# Patient Record
Sex: Female | Born: 1995 | Race: Black or African American | Hispanic: No | Marital: Single | Smoking: Never smoker
Health system: Southern US, Community
[De-identification: ages and names within clinical notes are randomized; demographics above are authoritative.]

## PROBLEM LIST (undated history)

## (undated) DIAGNOSIS — R4589 Other symptoms and signs involving emotional state: Secondary | ICD-10-CM

## (undated) DIAGNOSIS — Z8659 Personal history of other mental and behavioral disorders: Secondary | ICD-10-CM

## (undated) DIAGNOSIS — Z975 Presence of (intrauterine) contraceptive device: Secondary | ICD-10-CM

## (undated) DIAGNOSIS — R51 Headache: Secondary | ICD-10-CM

## (undated) DIAGNOSIS — R519 Headache, unspecified: Secondary | ICD-10-CM

## (undated) DIAGNOSIS — N926 Irregular menstruation, unspecified: Principal | ICD-10-CM

## (undated) HISTORY — DX: Irregular menstruation, unspecified: N92.6

## (undated) HISTORY — DX: Presence of (intrauterine) contraceptive device: Z97.5

## (undated) HISTORY — DX: Other symptoms and signs involving emotional state: R45.89

## (undated) HISTORY — PX: TONSILLECTOMY: SUR1361

---
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
## 2015-01-11 NOTE — L&D Delivery Note (Signed)
Delivery Note  Pt admitted for FHR variables and possible latent phase labor. Ctx began to increase spontaneously during the night, and then as pt was up in the bathroom she began having an urge to push. She got back to bed and pushed briefly and at 2:35 AM a viable female was delivered via Vaginal, Spontaneous Delivery (Presentation: ; Occiput Anterior). Pt in hands and knees position.  APGAR: 8, 9; weight: pending .  Cord clamped and cut by mother of pt while pt still in H&K due to short cord. Hospital cord blood sample collected.  Placenta status: Intact, Spontaneous.  Cord: 3 vessels with the following complications: Short.   Anesthesia: None  Episiotomy: None Lacerations:  none Est. Blood Loss (mL):    Mom to postpartum.  Baby to Couplet care / Skin to Skin.  Cam Hai CNM 03/12/2015, 2:50 AM

## 2015-02-23 ENCOUNTER — Other Ambulatory Visit: Payer: Self-pay | Admitting: Obstetrics & Gynecology

## 2015-02-23 DIAGNOSIS — Z1389 Encounter for screening for other disorder: Secondary | ICD-10-CM

## 2015-02-24 ENCOUNTER — Ambulatory Visit (INDEPENDENT_AMBULATORY_CARE_PROVIDER_SITE_OTHER): Payer: Medicaid Other

## 2015-02-24 DIAGNOSIS — Z3A38 38 weeks gestation of pregnancy: Secondary | ICD-10-CM | POA: Diagnosis not present

## 2015-02-24 DIAGNOSIS — Z1389 Encounter for screening for other disorder: Secondary | ICD-10-CM

## 2015-02-24 DIAGNOSIS — Z36 Encounter for antenatal screening of mother: Secondary | ICD-10-CM

## 2015-02-24 NOTE — Progress Notes (Signed)
[place] 37+4 wks by today's ultrasound,post pl gr 3,cephalic,efw 3421 g,normal ov's bilat, afi 12 cm,fhr 141 bpm,rt pyelectasis 9.71mm,fhr 141 bpm,anatomy complete but limited because of fetal age.

## 2015-02-25 ENCOUNTER — Ambulatory Visit (INDEPENDENT_AMBULATORY_CARE_PROVIDER_SITE_OTHER): Payer: Medicaid Other | Admitting: Advanced Practice Midwife

## 2015-02-25 ENCOUNTER — Encounter: Payer: Self-pay | Admitting: Advanced Practice Midwife

## 2015-02-25 VITALS — BP 104/68 | HR 100 | Wt 202.0 lb

## 2015-02-25 DIAGNOSIS — O0930 Supervision of pregnancy with insufficient antenatal care, unspecified trimester: Secondary | ICD-10-CM

## 2015-02-25 DIAGNOSIS — Z1389 Encounter for screening for other disorder: Secondary | ICD-10-CM | POA: Diagnosis not present

## 2015-02-25 DIAGNOSIS — Z3493 Encounter for supervision of normal pregnancy, unspecified, third trimester: Secondary | ICD-10-CM | POA: Diagnosis not present

## 2015-02-25 DIAGNOSIS — Z0283 Encounter for blood-alcohol and blood-drug test: Secondary | ICD-10-CM

## 2015-02-25 DIAGNOSIS — Z369 Encounter for antenatal screening, unspecified: Secondary | ICD-10-CM

## 2015-02-25 DIAGNOSIS — Z3A38 38 weeks gestation of pregnancy: Secondary | ICD-10-CM

## 2015-02-25 DIAGNOSIS — Z3490 Encounter for supervision of normal pregnancy, unspecified, unspecified trimester: Secondary | ICD-10-CM | POA: Insufficient documentation

## 2015-02-25 DIAGNOSIS — Z331 Pregnant state, incidental: Secondary | ICD-10-CM | POA: Diagnosis not present

## 2015-02-25 DIAGNOSIS — O09893 Supervision of other high risk pregnancies, third trimester: Secondary | ICD-10-CM | POA: Insufficient documentation

## 2015-02-25 DIAGNOSIS — Z3685 Encounter for antenatal screening for Streptococcus B: Secondary | ICD-10-CM

## 2015-02-25 LAB — POCT URINALYSIS DIPSTICK
Glucose, UA: NEGATIVE
Ketones, UA: NEGATIVE
Leukocytes, UA: NEGATIVE
Nitrite, UA: NEGATIVE
PROTEIN UA: NEGATIVE
RBC UA: NEGATIVE

## 2015-02-25 NOTE — Progress Notes (Signed)
  Subjective:    Catherine Le is a G2P1001 [redacted]w[redacted]d being seen today for her first obstetrical visit.  Her obstetrical history is significant for late to care at 37 weeks.,  Has a 45 month old .  Pregnancy history fully reviewed.  Patient reports no complaints.  Filed Vitals:   02/25/15 1410  BP: 104/68  Pulse: 100  Weight: 202 lb (91.627 kg)    HISTORY: OB History  Gravida Para Term Preterm AB SAB TAB Ectopic Multiple Living  0 1    # Outcome Date GA Lbr Len/2nd Weight Sex Delivery Anes PTL Lv  2 Current           1 Term 01/03/14 110w2d / 02:48 7 lb 1 oz (3.204 kg) F Vag-Spont None N Y     Past Medical History  Diagnosis Date  . Headache    Past Surgical History  Procedure Laterality Date  . Tonsillectomy     Family History  Problem Relation Age of Onset  . Hypertension Father   . Asthma Father   . Hypertension Maternal Grandmother   . Cancer Maternal Grandmother     lung  . Hypertension Maternal Grandfather   . Cancer Maternal Aunt     breast   US 37+4 wks by today's ultrasound,post pl gr 3,cephalic,efw 3421 g,normal ov's bilat, afi 12 cm,fhr 141 bpm,rt pyelectasis 9.69mm,fhr 141 bpm,anatomy complete but limited because of fetal age.  Exam       Pelvic Exam:    Perineum: Normal Perineum   Vulva: normal   Vagina:  normal mucosa, normal discharge, no palpable nodules   Uterus Normal, Gravid, FH: 38     Cervix: LTC   Adnexa: Not palpable   Urinary:  urethral meatus normal    System:     Skin: normal coloration and turgor, no rashes    Neurologic: oriented, normal, normal mood   Extremities: normal strength, tone, and muscle mass   HEENT PERRLA   Mouth/Teeth mucous membranes moist, normal dentition   Neck supple and no masses   Cardiovascular: regular rate and rhythm   Respiratory:  appears well, vitals normal, no respiratory distress, acyanotic   Abdomen: soft, non-tender;  FHR: 150          Assessment:    Pregnancy:  G2P1001 Patient Active Problem List   Diagnosis Date Noted  . Supervision of normal pregnancy 02/25/2015  . Short interval between pregnancies affecting pregnancy in third trimester, antepartum 02/25/2015  . Late prenatal care affecting pregnancy 11/18/2013        Plan:     Initial labs drawn, going to do a one hour gtt today Continue prenatal vitamins  Problem list reviewed and updated  Encouraged well-balanced diet Genetic Screening:  Too late.  Ultrasound discussed; fetal survey: results reviewed.  Return in about 1 week (aroDANNIE WOOLEN/22/2017) for LROB.  CRESENZO-DISHMAN,[name] 02/25/2015

## 2015-02-25 NOTE — Patient Instructions (Signed)
Tdap Vaccine  It is recommended that you get the Tdap vaccine during the third trimester of EACH pregnancy to help protect your baby from getting pertussis (whooping cough)  27-36 weeks is the BEST time to do this so that you can pass the protection on to your baby. During pregnancy is better than after pregnancy, but if you are unable to get it during pregnancy it will be offered at the hospital.   You can get this vaccine at the health department or your family doctor  Everyone who will be around your baby should also be up-to-date on their vaccines. Adults (who are not pregnant) only need 1 dose of Tdap during adulthood.    AM I IN LABOR? What is labor? Labor is the work that your body does to birth your baby. Your uterus (the womb) contracts. Your cervix (the mouth of the uterus) opens. You will push your baby out into the world.  What do contractions (labor pains) feel like? When they first start, contractions usually feel like cramps during your period. Sometimes you feel pain in your back. Most often, contractions feel like muscles pulling painfully in your lower belly. At first, the contractions will probably be 15 to 20 minutes apart. They will not feel too painful. As labor goes on, the contractions get stronger, closer together, and more painful.  How do I time the contractions? Time your contractions by counting the number of minutes from the start of one contraction to the start of the next contraction.  What should I do when the contractions start? If it is night and you can sleep, sleep. If it happens during the day, here are some things you can do to take care of yourself at home: ? Walk. If the pains you are having are real labor, walking will make the contractions come faster and harder. If the contractions are not going to continue and be real labor, walking will make the contractions slow down. ? Take a shower or bath. This will help you relax. ? Eat. Labor is a big  event. It takes a lot of energy. ? Drink water. Not drinking enough water can cause false labor (contractions that hurt but do not open your cervix). If this is true labor, drinking water will help you have strength to get through your labor. ? Take a nap. Get all the rest you can. ? Get a massage. If your labor is in your back, a strong massage on your lower back may feel very good. Getting a foot massage is always good. ? Don't panic. You can do this. Your body was made for this. You are strong!  When should I go to the hospital or call my health care provider? ? Your contractions have been 5 minutes apart or less for at least 1 hour. ? If several contractions are so painful you cannot walk or talk during one. ? Your bag of waters breaks. (You may have a big gush of water or just water that runs down your legs when you walk.)  Are there other reasons to call my health care provider? Yes, you should call your health care provider or go to the hospital if you start to bleed like you are having a period- blood that soaks your underwear or runs down your legs, if you have sudden severe pain, if your baby has not moved for several hours, or if you are leaking green fluid. The rule is as follows: If you are very concerned about  something, call.

## 2015-02-26 LAB — AB SCR+ANTIBODY ID: ANTIBODY SCREEN: POSITIVE — AB

## 2015-02-26 LAB — MICROSCOPIC EXAMINATION: Casts: NONE SEEN /lpf

## 2015-02-26 LAB — URINALYSIS, ROUTINE W REFLEX MICROSCOPIC
BILIRUBIN UA: NEGATIVE
Glucose, UA: NEGATIVE
Ketones, UA: NEGATIVE
NITRITE UA: NEGATIVE
PH UA: 7 (ref 5.0–7.5)
Protein, UA: NEGATIVE
RBC UA: NEGATIVE
Specific Gravity, UA: 1.019 (ref 1.005–1.030)
UUROB: 1 mg/dL (ref 0.2–1.0)

## 2015-02-26 LAB — URINE CULTURE

## 2015-02-26 LAB — ANTIBODY SCREEN

## 2015-02-26 LAB — PMP SCREEN PROFILE (10S), URINE
Amphetamine Screen, Ur: NEGATIVE ng/mL
BENZODIAZEPINE SCREEN, URINE: NEGATIVE ng/mL
Barbiturate Screen, Ur: NEGATIVE ng/mL
CANNABINOIDS UR QL SCN: NEGATIVE ng/mL
Cocaine(Metab.)Screen, Urine: NEGATIVE ng/mL
Creatinine(Crt), U: 118.5 mg/dL (ref 20.0–300.0)
Methadone Scn, Ur: NEGATIVE ng/mL
OXYCODONE+OXYMORPHONE UR QL SCN: NEGATIVE ng/mL
Opiate Scrn, Ur: NEGATIVE ng/mL
PCP Scrn, Ur: NEGATIVE ng/mL
Ph of Urine: 6.9 (ref 4.5–8.9)
Propoxyphene, Screen: NEGATIVE ng/mL

## 2015-02-26 LAB — CBC
HEMATOCRIT: 32.2 % — AB (ref 34.0–46.6)
Hemoglobin: 10.1 g/dL — ABNORMAL LOW (ref 11.1–15.9)
MCH: 25.6 pg — ABNORMAL LOW (ref 26.6–33.0)
MCHC: 31.4 g/dL — AB (ref 31.5–35.7)
MCV: 82 fL (ref 79–97)
Platelets: 335 10*3/uL (ref 150–379)
RBC: 3.94 x10E6/uL (ref 3.77–5.28)
RDW: 15.8 % — ABNORMAL HIGH (ref 12.3–15.4)
WBC: 10.2 10*3/uL (ref 3.4–10.8)

## 2015-02-26 LAB — HIV ANTIBODY (ROUTINE TESTING W REFLEX): HIV SCREEN 4TH GENERATION: NONREACTIVE

## 2015-02-26 LAB — HEPATITIS B SURFACE ANTIGEN: HEP B S AG: NEGATIVE

## 2015-02-26 LAB — RPR: RPR: NONREACTIVE

## 2015-02-26 LAB — GLUCOSE TOLERANCE, 1 HOUR: Glucose, 1Hr PP: 63 mg/dL — ABNORMAL LOW (ref 65–199)

## 2015-02-27 LAB — GC/CHLAMYDIA PROBE AMP
CHLAMYDIA, DNA PROBE: NEGATIVE
NEISSERIA GONORRHOEAE BY PCR: NEGATIVE

## 2015-02-27 LAB — STREP GP B NAA: Strep Gp B NAA: POSITIVE — AB

## 2015-03-02 ENCOUNTER — Encounter: Payer: Self-pay | Admitting: Obstetrics & Gynecology

## 2015-03-06 ENCOUNTER — Ambulatory Visit (INDEPENDENT_AMBULATORY_CARE_PROVIDER_SITE_OTHER): Payer: Medicaid Other | Admitting: Obstetrics and Gynecology

## 2015-03-06 ENCOUNTER — Encounter: Payer: Self-pay | Admitting: Obstetrics and Gynecology

## 2015-03-06 VITALS — BP 118/76 | HR 96 | Wt 202.0 lb

## 2015-03-06 DIAGNOSIS — Z3A39 39 weeks gestation of pregnancy: Secondary | ICD-10-CM

## 2015-03-06 DIAGNOSIS — Z331 Pregnant state, incidental: Secondary | ICD-10-CM

## 2015-03-06 DIAGNOSIS — Z3493 Encounter for supervision of normal pregnancy, unspecified, third trimester: Secondary | ICD-10-CM

## 2015-03-06 DIAGNOSIS — O0933 Supervision of pregnancy with insufficient antenatal care, third trimester: Secondary | ICD-10-CM | POA: Diagnosis not present

## 2015-03-06 DIAGNOSIS — Z1389 Encounter for screening for other disorder: Secondary | ICD-10-CM

## 2015-03-06 DIAGNOSIS — Z3483 Encounter for supervision of other normal pregnancy, third trimester: Secondary | ICD-10-CM | POA: Diagnosis not present

## 2015-03-06 DIAGNOSIS — O0930 Supervision of pregnancy with insufficient antenatal care, unspecified trimester: Secondary | ICD-10-CM

## 2015-03-06 LAB — POCT URINALYSIS DIPSTICK
Blood, UA: NEGATIVE
GLUCOSE UA: NEGATIVE
Ketones, UA: NEGATIVE
LEUKOCYTES UA: NEGATIVE
NITRITE UA: NEGATIVE

## 2015-03-06 NOTE — Progress Notes (Signed)
Pt denies any problems or concerns at this time.  

## 2015-03-06 NOTE — Progress Notes (Signed)
Patient ID: Catherine Le, female   DOB: 12/17/1995, 20 y.o.   MRN: [id number]  G2P1001 [redacted]w[redacted]d Estimated Date of Delivery: 03/13/15  Blood pressure 118/76, pulse 96, weight 202 lb (91.627 kg), unknown if currently breastfeeding.    Catherine Le is a 20 y.o. female who presents today for a routine prenatal visit. Pt notes that this is her second pregnancy and that her first pregnancy was a vaginal delivery. Pt denies any issues or complaints at this time. She states that her contraceptive measures following this pregnancy will be either the nuvaring or nexplanon. Pt selecting Nexplanon, as she'll breast feed.  FHR: 143 FH: 36 cm  refer to the ob flow sheet for FH and FHR, also BP, Wt, Urine results: negative  Patient reports good fetal movement, denies any bleeding and no rupture of membranes symptoms or regular contractions. Patient complaints: N/A.  Questions were answered. Assessment: LROB G2P1001 @ [redacted]w[redacted]d  Plan:  Continued routine obstetrical care.  F/u in 1 week for routine prenatal visit br fdg BC= nexplanon  By signing my name below, I, Soijett Blue, attest that this documentation has been prepared under the direction and in the presence of Tilda Burrow, MD. Electronically Signed: Soijett Blue, ED Scribe. 03/06/2015. 11:15 AM.  I personally performed the services described in this documentation, which was SCRIBED in my presence. The recorded information has been reviewed and considered accurate. It has been edited as necessary during review. Tilda Burrow, MD

## 2015-03-10 ENCOUNTER — Telehealth: Payer: Self-pay | Admitting: *Deleted

## 2015-03-10 NOTE — Telephone Encounter (Signed)
She says she can't hold her pee, but I think her water may have broke, put on clean pad and go to MAU now.

## 2015-03-11 ENCOUNTER — Encounter (HOSPITAL_COMMUNITY): Payer: Self-pay | Admitting: *Deleted

## 2015-03-11 ENCOUNTER — Inpatient Hospital Stay (HOSPITAL_COMMUNITY)
Admission: AD | Admit: 2015-03-11 | Discharge: 2015-03-14 | DRG: 775 | Disposition: A | Discharge status: Home / Self Care | Payer: Medicaid Other | Source: Ambulatory Visit | Attending: Obstetrics & Gynecology | Admitting: Obstetrics & Gynecology

## 2015-03-11 DIAGNOSIS — IMO0002 Reserved for concepts with insufficient information to code with codable children: Secondary | ICD-10-CM | POA: Diagnosis present

## 2015-03-11 DIAGNOSIS — O09893 Supervision of other high risk pregnancies, third trimester: Secondary | ICD-10-CM

## 2015-03-11 DIAGNOSIS — O99824 Streptococcus B carrier state complicating childbirth: Secondary | ICD-10-CM | POA: Diagnosis present

## 2015-03-11 DIAGNOSIS — Z3A39 39 weeks gestation of pregnancy: Secondary | ICD-10-CM

## 2015-03-11 DIAGNOSIS — O4292 Full-term premature rupture of membranes, unspecified as to length of time between rupture and onset of labor: Secondary | ICD-10-CM | POA: Diagnosis present

## 2015-03-11 DIAGNOSIS — O358XX0 Maternal care for other (suspected) fetal abnormality and damage, not applicable or unspecified: Secondary | ICD-10-CM | POA: Diagnosis present

## 2015-03-11 DIAGNOSIS — Z8249 Family history of ischemic heart disease and other diseases of the circulatory system: Secondary | ICD-10-CM | POA: Diagnosis not present

## 2015-03-11 DIAGNOSIS — Z3493 Encounter for supervision of normal pregnancy, unspecified, third trimester: Secondary | ICD-10-CM

## 2015-03-11 LAB — CBC
HCT: 32 % — ABNORMAL LOW (ref 36.0–46.0)
HEMOGLOBIN: 10.5 g/dL — AB (ref 12.0–15.0)
MCH: 26.2 pg (ref 26.0–34.0)
MCHC: 32.8 g/dL (ref 30.0–36.0)
MCV: 79.8 fL (ref 78.0–100.0)
PLATELETS: 315 10*3/uL (ref 150–400)
RBC: 4.01 MIL/uL (ref 3.87–5.11)
RDW: 16.2 % — ABNORMAL HIGH (ref 11.5–15.5)
WBC: 13.7 10*3/uL — ABNORMAL HIGH (ref 4.0–10.5)

## 2015-03-11 LAB — AMNISURE RUPTURE OF MEMBRANE (ROM) NOT AT ARMC: AMNISURE: NEGATIVE

## 2015-03-11 MED ORDER — OXYTOCIN BOLUS FROM INFUSION
500.0000 mL | INTRAVENOUS | Status: DC
Start: 2015-03-11 — End: 2015-03-12
  Administered 2015-03-12: 500 mL via INTRAVENOUS

## 2015-03-11 MED ORDER — OXYCODONE-ACETAMINOPHEN 5-325 MG PO TABS: 1.0000 | ORAL_TABLET | ORAL | Status: DC | PRN

## 2015-03-11 MED ORDER — ONDANSETRON HCL 4 MG/2ML IJ SOLN
4.0000 mg | Freq: Four times a day (QID) | INTRAMUSCULAR | Status: DC | PRN
  Administered 2015-03-12: 4 mg via INTRAVENOUS
  Filled 2015-03-11: qty 2

## 2015-03-11 MED ORDER — LIDOCAINE HCL (PF) 1 % IJ SOLN
30.0000 mL | INTRAMUSCULAR | Status: DC | PRN
  Filled 2015-03-11: qty 30

## 2015-03-11 MED ORDER — OXYTOCIN 10 UNIT/ML IJ SOLN
2.5000 [IU]/h | INTRAVENOUS | Status: DC
  Administered 2015-03-12: 2.5 [IU]/h via INTRAVENOUS
  Filled 2015-03-11: qty 10

## 2015-03-11 MED ORDER — FENTANYL CITRATE (PF) 100 MCG/2ML IJ SOLN
100.0000 ug | INTRAMUSCULAR | Status: DC | PRN
  Administered 2015-03-12: 100 ug via INTRAVENOUS
  Filled 2015-03-11: qty 2

## 2015-03-11 MED ORDER — PENICILLIN G POTASSIUM 5000000 UNITS IJ SOLR
5.0000 10*6.[IU] | Freq: Once | INTRAVENOUS | Status: AC
  Administered 2015-03-12: 5 10*6.[IU] via INTRAVENOUS
  Filled 2015-03-11: qty 5

## 2015-03-11 MED ORDER — CITRIC ACID-SODIUM CITRATE 334-500 MG/5ML PO SOLN: 30.0000 mL | ORAL | Status: DC | PRN

## 2015-03-11 MED ORDER — OXYCODONE-ACETAMINOPHEN 5-325 MG PO TABS: 2.0000 | ORAL_TABLET | ORAL | Status: DC | PRN

## 2015-03-11 MED ORDER — LACTATED RINGERS IV SOLN
INTRAVENOUS | Status: DC
Start: 2015-03-11 — End: 2015-03-12
  Administered 2015-03-12: 01:00:00 via INTRAVENOUS

## 2015-03-11 MED ORDER — ACETAMINOPHEN 325 MG PO TABS: 650.0000 mg | ORAL_TABLET | ORAL | Status: DC | PRN

## 2015-03-11 MED ORDER — LACTATED RINGERS IV SOLN
500.0000 mL | INTRAVENOUS | Status: DC | PRN
  Administered 2015-03-11: 500 mL via INTRAVENOUS

## 2015-03-11 MED ORDER — DEXTROSE 5 % IV SOLN
2.5000 10*6.[IU] | INTRAVENOUS | Status: DC
  Filled 2015-03-11 (×2): qty 2.5

## 2015-03-11 NOTE — MAU Note (Signed)
Pt presents complaining of possible SROM yesterday at 0600. Also having lower abdominal pain and pressure. Reports good fetal movement. Denies bleeding

## 2015-03-11 NOTE — H&P (Signed)
Catherine Le is a 20 y.o. female G2P1001 @ 39.5wks by 37wk scan (unk LMP) presenting for eval for SROM @ 0600 2/28. Denies reg ctx but having vag pressure. Her preg has been followed by [facility] since 2/14 and has been remarkable for 1) late to care 2) GBS pos 3) R fetal pyelectasis 9mm 4) anti-Lewis antibody + 5) social issues (FOB not involved; pt denial of preg)  History OB History    Gravida Para Term Preterm AB TAB SAB Ectopic Multiple Living   0 1     Past Medical History  Diagnosis Date  . Headache    Past Surgical History  Procedure Laterality Date  . Tonsillectomy     Family History: family history includes Asthma in her father; Cancer in her maternal aunt and maternal grandmother; Hypertension in her father, maternal grandfather, and maternal grandmother. Social History:  reports that she has never smoked. She has never used smokeless tobacco. She reports that she does not drink alcohol or use illicit drugs.   Prenatal Transfer Tool  Maternal Diabetes: No Genetic Screening: Declined- too late Maternal Ultrasounds/Referrals: Abnormal:  Findings:   Fetal renal pyelectasis- right, 9.66mm Fetal Ultrasounds or other Referrals:  None Maternal Substance Abuse:  No Significant Maternal Medications:  None Significant Maternal Lab Results:  Lab values include: Group B Strep positive Other Comments:  PNC since 37wks w/ [place] dating at that time  ROS  Dilation: 3 Effacement (%): 60 Station: -3 Exam by:: E. Siska, RN Blood pressure 121/80, pulse 96, temperature 98.2 F (36.8 C), temperature source Oral, resp. rate 18, unknown if currently breastfeeding. Exam Physical Exam  Constitutional: She is oriented to person, place, and time. She appears well-developed.  HENT:  Head: Normocephalic.  Neck: Normal range of motion.  Cardiovascular: Normal rate.   Respiratory: Effort normal.  GI:  EFM 130s, +accels, occ early variables w/ some ctx to 90-100s w/ spont  return; avg LTV Ctx irreg 3-6 mins  Musculoskeletal: Normal range of motion.  Neurological: She is alert and oriented to person, place, and time.  Skin: Skin is warm and dry.  Psychiatric: She has a normal mood and affect. Her behavior is normal. Thought content normal.    Amnisure: neg  Prenatal labs: ABO, Rh:  A pos (11/15) Antibody: Positive, See Final Results (02/15 1604) Rubella:  immune (11/15) RPR: Non Reactive (02/15 1604)  HBsAg: Negative (02/15 1604)  HIV: Non Reactive (02/15 1604)  GBS: Positive (02/15 1600)   Assessment/Plan: IUP@term  FHR variables Possible early labor GBS pos  Admit to [facility] for GBS ppx Expectant management Watch FHR If no cx change, augment prn   Charbel Los CNM 03/11/2015, 8:29 PM

## 2015-03-12 ENCOUNTER — Encounter (HOSPITAL_COMMUNITY): Payer: Self-pay | Admitting: *Deleted

## 2015-03-12 DIAGNOSIS — O99824 Streptococcus B carrier state complicating childbirth: Secondary | ICD-10-CM

## 2015-03-12 DIAGNOSIS — Z3A39 39 weeks gestation of pregnancy: Secondary | ICD-10-CM

## 2015-03-12 DIAGNOSIS — O4292 Full-term premature rupture of membranes, unspecified as to length of time between rupture and onset of labor: Secondary | ICD-10-CM

## 2015-03-12 DIAGNOSIS — O358XX0 Maternal care for other (suspected) fetal abnormality and damage, not applicable or unspecified: Secondary | ICD-10-CM

## 2015-03-12 LAB — RPR: RPR: NONREACTIVE

## 2015-03-12 MED ORDER — DIBUCAINE 1 % RE OINT: 1.0000 "application " | TOPICAL_OINTMENT | RECTAL | Status: DC | PRN

## 2015-03-12 MED ORDER — PRENATAL MULTIVITAMIN CH
1.0000 | ORAL_TABLET | Freq: Every day | ORAL | Status: DC
  Administered 2015-03-12 – 2015-03-14 (×3): 1 via ORAL
  Filled 2015-03-12 (×3): qty 1

## 2015-03-12 MED ORDER — ACETAMINOPHEN 325 MG PO TABS: 650.0000 mg | ORAL_TABLET | ORAL | Status: DC | PRN

## 2015-03-12 MED ORDER — BENZOCAINE-MENTHOL 20-0.5 % EX AERO
1.0000 "application " | INHALATION_SPRAY | CUTANEOUS | Status: DC | PRN
  Administered 2015-03-12: 1 via TOPICAL
  Filled 2015-03-12: qty 56

## 2015-03-12 MED ORDER — ONDANSETRON HCL 4 MG PO TABS: 4.0000 mg | ORAL_TABLET | ORAL | Status: DC | PRN

## 2015-03-12 MED ORDER — SENNOSIDES-DOCUSATE SODIUM 8.6-50 MG PO TABS
2.0000 | ORAL_TABLET | ORAL | Status: DC
  Administered 2015-03-13 (×2): 2 via ORAL
  Filled 2015-03-12 (×2): qty 2

## 2015-03-12 MED ORDER — WITCH HAZEL-GLYCERIN EX PADS: 1.0000 "application " | MEDICATED_PAD | CUTANEOUS | Status: DC | PRN

## 2015-03-12 MED ORDER — IBUPROFEN 600 MG PO TABS
600.0000 mg | ORAL_TABLET | Freq: Four times a day (QID) | ORAL | Status: DC
  Administered 2015-03-12 – 2015-03-14 (×10): 600 mg via ORAL
  Filled 2015-03-12 (×10): qty 1

## 2015-03-12 MED ORDER — DIPHENHYDRAMINE HCL 25 MG PO CAPS
25.0000 mg | ORAL_CAPSULE | Freq: Four times a day (QID) | ORAL | Status: DC | PRN
Start: 2015-03-12 — End: 2015-03-14

## 2015-03-12 MED ORDER — NALBUPHINE HCL 10 MG/ML IJ SOLN: 10.0000 mg | INTRAMUSCULAR | Status: DC | PRN

## 2015-03-12 MED ORDER — ZOLPIDEM TARTRATE 5 MG PO TABS: 5.0000 mg | ORAL_TABLET | Freq: Every evening | ORAL | Status: DC | PRN

## 2015-03-12 MED ORDER — ONDANSETRON HCL 4 MG/2ML IJ SOLN: 4.0000 mg | INTRAMUSCULAR | Status: DC | PRN

## 2015-03-12 MED ORDER — SIMETHICONE 80 MG PO CHEW: 80.0000 mg | CHEWABLE_TABLET | ORAL | Status: DC | PRN

## 2015-03-12 MED ORDER — TETANUS-DIPHTH-ACELL PERTUSSIS 5-2.5-18.5 LF-MCG/0.5 IM SUSP: 0.5000 mL | Freq: Once | INTRAMUSCULAR | Status: DC

## 2015-03-12 MED ORDER — LANOLIN HYDROUS EX OINT: TOPICAL_OINTMENT | CUTANEOUS | Status: DC | PRN

## 2015-03-12 NOTE — Lactation Note (Signed)
This note was copied from a baby's chart. Lactation Consultation Note  Baby 12 hours old.  Mother states she had trouble latching her first child and pumped instead of breastfeeding. Oral assessment indicated Baby tongue thrusting.   Mother leaking colostrum during consult on R side. RN requested possibly trying a NS and patient asked about NS when entering room. Attempted latching in football hold on R side.  Baby recently breastfed for 5-10 min on L side. Baby latched briefly but did not sustain suck. Applied #24NS and baby would not open to latch.  Mouthed nipple briefly and became sleepy. Suggest mother call for assistance w/ latching for next feeding.  Patient Name: Catherine Le Today's Date: 03/12/2015     Maternal Data    Feeding Feeding Type: Breast Fed Length of feed: 15 min  LATCH Score/Interventions Latch: Grasps breast easily, tongue down, lips flanged, rhythmical sucking.  Audible Swallowing: A few with stimulation Intervention(s): Skin to skin  Type of Nipple: Everted at rest and after stimulation (wide)  Comfort (Breast/Nipple): Soft / non-tender     Hold (Positioning): Full assist, staff holds infant at breast Intervention(s): Breastfeeding basics reviewed;Support Pillows;Position options;Skin to skin  LATCH Score: 7  Lactation Tools Discussed/Used     Consult Status      Dahlia Byes [facility] 03/12/2015, 2:42 PM

## 2015-03-12 NOTE — Lactation Note (Signed)
This note was copied from a baby's chart. Lactation Consultation Note  Mom pumped 7 mls of colostrum and disappointed she didn't obtain more volume.  Reassured that this is a good amount and sufficient for a feeding.  Reminded of baby's small stomach size.  Reviewed pros and cons of syringe/fingerfeeding vs bottle.  Mom chooses to give milk with a bottle nipple.  Report given to [facility] RN.  Patient Name: Catherine Le [id number] Date: 03/12/2015 Reason for consult: Follow-up assessment;Difficult latch   Maternal Data    Feeding Feeding Type: Breast Fed  LATCH Score/Interventions Latch: Repeated attempts needed to sustain latch, nipple held in mouth throughout feeding, stimulation needed to elicit sucking reflex. Intervention(s): Adjust position;Assist with latch;Breast massage;Breast compression  Audible Swallowing: None Intervention(s): Hand expression Intervention(s): Hand expression;Alternate breast massage  Type of Nipple: Everted at rest and after stimulation  Comfort (Breast/Nipple): Soft / non-tender     Hold (Positioning): Assistance needed to correctly position infant at breast and maintain latch. Intervention(s): Breastfeeding basics reviewed;Support Pillows;Position options  LATCH Score: 6  Lactation Tools Discussed/Used Tools: Nipple Shields Nipple shield size: 24 Pump Review: Setup, frequency, and cleaning;Milk Storage Initiated by:: LC Date initiated:: 03/12/15   Consult Status Consult Status: Follow-up Date: 03/13/15 Follow-up type: In-patient    Huston Foley 03/12/2015, 6:45 PM

## 2015-03-12 NOTE — Lactation Note (Signed)
This note was copied from a baby's chart. Lactation Consultation Note  Patient Name: Catherine Le Today's Date: 03/12/2015 Reason for consult: Initial assessment Visited with Mom, baby 8 hrs old.  This is Mom's 2nd baby, first baby she exclusively pumped and bottle fed as her choice.  So far baby has latched 3 times for 15 and 5 minutes, latch score of 9 given.   Mom falling asleep in bed as we were talking, and GMOB holding baby.  Encouraged Mom to call for assistance as needed with latches.  Mom aware of importance of manual breast expression to initiate and stimulate flow.  Encouraged skin to skin and feeding often on cue.   Brochure left with Mom, and told about our IP lactation services available to her.  To follow up in am.  Consult Status Consult Status: Follow-up Date: 03/13/15 Follow-up type: In-patient    Judee Clara 03/12/2015, 10:45 AM

## 2015-03-12 NOTE — Lactation Note (Signed)
This note was copied from a baby's chart. Lactation Consultation Note  Mom called out for latch assist.  Baby is 15 hours old and in a quiet alert state showing early feeding cues.  Assisted with positioning baby in cross cradle hold.  Mom can easily hand express colostrum.  Baby does not easily open wide.  She did latch on once for 30 seconds but mom took her off because latch wasn't comfortable.  Nipple shield applied but baby would not open her mouth.  Mom does not want to try anymore at this point and asking to be set up with a pump so she can pump and bottle feed as she did with her first baby.  I explained that baby is only 15 hours old and still learning.  Encouraged mom to continue to attempt putting the baby to breast and call for assist prn.  DEBP set up and initiated with instructions.  Mom will call out for assist with syringe feeding.  Patient Name: Catherine Le [id number] Date: 03/12/2015 Reason for consult: Follow-up assessment;Difficult latch   Maternal Data    Feeding Feeding Type: Breast Fed  LATCH Score/Interventions Latch: Repeated attempts needed to sustain latch, nipple held in mouth throughout feeding, stimulation needed to elicit sucking reflex. Intervention(s): Adjust position;Assist with latch;Breast massage;Breast compression  Audible Swallowing: None Intervention(s): Hand expression Intervention(s): Hand expression;Alternate breast massage  Type of Nipple: Everted at rest and after stimulation  Comfort (Breast/Nipple): Soft / non-tender     Hold (Positioning): Assistance needed to correctly position infant at breast and maintain latch. Intervention(s): Breastfeeding basics reviewed;Support Pillows;Position options  LATCH Score: 6  Lactation Tools Discussed/Used Tools: Nipple Shields Nipple shield size: 24 Pump Review: Setup, frequency, and cleaning;Milk Storage Initiated by:: LC Date initiated:: 03/12/15   Consult Status Consult Status:  Follow-up Date: 03/13/15 Follow-up type: In-patient    Huston Foley 03/12/2015, 6:25 PM

## 2015-03-13 ENCOUNTER — Encounter: Payer: Medicaid Other | Admitting: Obstetrics and Gynecology

## 2015-03-13 NOTE — Clinical Social Work Maternal (Signed)
CLINICAL SOCIAL WORK MATERNAL/CHILD NOTE  Patient Details  Name: Catherine Le MRN: [id number] Date of Birth: 01-10-1996  Date:  03/13/2015  Clinical Social Worker Initiating Note:  Loleta Books MSW, LCSW Date/ Time Initiated:  03/13/15/1130     Child's Name:  Unnamed at time of assessment   Legal Guardian:  Catherine Le FOB not involved   Need for Interpreter:  None   Date of Referral:  03/12/15     Reason for Referral:  Late or No Prenatal Care    Referral Source:  [facility]   Address:  [address] [place], [place] [address]  Phone number:  [phone]   Household Members:  Minor Children, Parents, Siblings   Natural Supports (not living in the home):  Immediate Family   Professional Supports: None   Employment: Consulting civil engineer   Type of Work:     Education:  Attending college (Nursing school)   Financial Resources:  Medicaid   Other Resources:  [facility]   Cultural/Religious Considerations Which May Impact Care:  None reported  Strengths:  Ability to meet basic needs , Pediatrician chosen , Home prepared for child    Risk Factors/Current Problems:   1. Late prenatal care at 38 weeks.   Cognitive State:  Able to Concentrate , Alert , Linear Thinking , Goal Oriented    Mood/Affect:  Bright , Comfortable , Happy    CSW Assessment:  CSW received request for consult due to MOB arriving late to prenatal care at [redacted]w[redacted]d.  MOB presented in a pleasant mood, displayed a full range in affect, and was receptive to visit.  MOB was observed to be holding and caring for the infant.    MOB stated that the infant had only recently started phototherapy. She stated that it is difficult to watch the infant when she begins to cry since she knows that it must be difficult for the infant and she has limitations on how she can best soothe her due to her needing to remain under the lights.  MOB expressed understanding for the need for phototherapy, and shared hopes that it  will only be short and brief.    MOB expressed eagerness and readiness for discharge. She stated that she has a one year old daughter at home.  MOB denied concerns about transitioning to caring for two, and stated that she is happy and excited. MOB shared that it was a shock and surprise at first, but has since become excited.  MOB stated that during her first pregnancy, she learned that she was pregnant when she was 8 months pregnant, and discussed how it was a fast transition for her to prepare for. MOB shared that with this pregnancy, she has had 2 months to prepare.  MOB discussed impressions how there was limited time to prepare, but stated that it was better than before. MOB shared that she lives with her mother and older sister, and all have assisted to prepare for the infant. MOB stated that they did not have baby items from her first child, so they had to start over.  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 MOB denied questions or concerns about balancing school work with parenting, and discussed looking forward to continuing school once she recovers from childbirth.  MOB denied history of perinatal mood disorders, and denied mental health complications during this pregnancy.  MOB presented as attentive and engaged as CSW provided education on perinatal mood disorders, and agreed to  follow up with her medical providers if needs arise.   CSW inquired about the events that led to prenatal care. MOB stated that she did not know that she was pregnant until 2 months ago, and then had a difficult time attending an appointment since [place] in [place]. MOB denied any additional barriers to accessing care, and denied any barriers to accessing care postpartum. MOB verbalized understanding of the hospital drug screen policy, and denied any substance use during the pregnancy.   MOB denied questions, concerns, or needs at  this time. She acknowledged ongoing availability of CSW, and agreed to contact if needs arise.   CSW Plan/Description:   1. Patient/Family Education-- Perinatal mood and anxiety disorder, hospital drug screen policy 2. CSW to monitor toxicology screen, and will refer to CPS if positive 3. No Further Intervention Required/No Barriers to Discharge    Kelby FamVenning, Johanthan Kneeland N, LCSW 03/13/2015, 12:14 PM

## 2015-03-13 NOTE — Progress Notes (Signed)
Post Partum Day 1 Subjective: no complaints, up ad lib, voiding and tolerating PO, small lochia, plans to breastfeed, [facility]  Objective: Blood pressure 119/71, pulse 80, temperature 98.4 F (36.9 C), temperature source Oral, resp. rate 16, height 5\' 1"  (1.549 m), weight 91.627 kg (202 lb), unknown if currently breastfeeding.  Physical Exam:  General: alert, cooperative and no distress Lochia:normal flow Chest: CTAB Heart: RRR no m/r/g Abdomen: +BS, soft, nontender,  Uterine Fundus: firm DVT Evaluation: No evidence of DVT seen on physical exam. Extremities: trace edema   Recent Labs  03/11/15 2100  HGB 10.5*  HCT 32.0*    Assessment/Plan: Plan for discharge tomorrow   LOS: 2 days   Catherine Le,Catherine Le Point 03/13/2015, 7:52 AM

## 2015-03-13 NOTE — Lactation Note (Signed)
This note was copied from a baby's chart. Lactation Consultation Note  Mother recently pumped approx 15 ml and has also been giving formula. Occasionally is latching but mainly wants to pump and bottle feed. Baby is on phototherapy. [facility] Encouraged mother to call if she would like assistance w/ latching or further questions.  Patient Name: Catherine Le HYQMV'HToday's Date: 03/13/2015 Reason for consult: Follow-up assessment   Maternal Data    Feeding Feeding Type: Bottle Fed - Formula Nipple Type: Slow - flow  LATCH Score/Interventions                      Lactation Tools Discussed/Used     Consult Status Consult Status: PRN    Hardie PulleyBerkelhammer, Ruth Boschen 03/13/2015, 1:54 PM

## 2015-03-14 ENCOUNTER — Ambulatory Visit: Payer: Self-pay

## 2015-03-14 MED ORDER — IBUPROFEN 600 MG PO TABS: 600.0000 mg | ORAL_TABLET | Freq: Four times a day (QID) | ORAL | Status: DC | PRN

## 2015-03-14 NOTE — Discharge Summary (Signed)
OB Discharge Summary     Patient Name: Catherine Le DOB: 1995/05/16 MRN: [id number]  Date of admission: 03/11/2015 Delivering MD: Cam Hai D   Date of discharge: 03/14/2015  Admitting diagnosis: 38WKS,CTX Intrauterine pregnancy: [redacted]w[redacted]d     Secondary diagnosis:  Active Problems:   FHR (fetal heart rate) nonreactive  Additional problems: fetal pyelectasis; anti-Lewis atb     Discharge diagnosis: Term Pregnancy Delivered                                                                                                Post partum procedures:none  Augmentation: none  Complications: None  Hospital course:  Onset of Labor With Vaginal Delivery     20 y.o. yo [id number] at [redacted]w[redacted]d was admitted in Latent Labor on 03/11/2015. Patient had an uncomplicated labor course as follows:  Membrane Rupture Time/Date: 2:25 AM ,03/10/2015   Intrapartum Procedures: Episiotomy: None [1]                                         Lacerations:  None [1]  Patient had a delivery of a Viable infant. 03/12/2015  Information for the patient's newborn:  Eraina, Winnie [[id number]]  Delivery Method: Vaginal, Spontaneous Delivery (Filed from Delivery Summary)    Pateint had an uncomplicated postpartum course.  She is ambulating, tolerating a regular diet, passing flatus, and urinating well. Patient is discharged home in stable condition on 03/14/2015.    Physical exam  Filed Vitals:   03/12/15 1700 03/13/15 0459 03/13/15 1826 03/14/15 0608  BP: 106/60 119/71 126/73 148/82  Pulse: 63 80 81 75  Temp: 98.4 F (36.9 C) 98.4 F (36.9 C) 98.5 F (36.9 C) 98.5 F (36.9 C)  TempSrc: Oral Oral Oral Oral  Resp: Height:      Weight:       General: alert and cooperative Lochia: appropriate Uterine Fundus: firm Incision: N/A DVT Evaluation: No evidence of DVT seen on physical exam. Labs: Lab Results  Component Value Date   WBC 13.7* 03/11/2015   HGB 10.5* 03/11/2015   HCT 32.0*  03/11/2015   MCV 79.8 03/11/2015   PLT 315 03/11/2015   No flowsheet data found.  Discharge instruction: per After Visit Summary and "Baby and Me Booklet".  After visit meds:    Medication List    TAKE these medications        ibuprofen 600 MG tablet  Commonly known as:  ADVIL,MOTRIN  Take 1 tablet (600 mg total) by mouth every 6 (six) hours as needed.     multivitamin-prenatal 27-0.8 MG Tabs tablet  Take 1 tablet by mouth daily at 12 noon. Reported on 03/06/2015        Diet: routine diet  Activity: Advance as tolerated. Pelvic rest for 6 weeks.   Outpatient follow up:6 weeks Follow up Appt:No future appointments. Follow up Visit:No Follow-up on file.  Postpartum contraception: Nexplanon  Newborn Data: Live born female  Birth Weight: 7 lb 0.5  oz (3189 g) APGAR: 8, 9  Baby Feeding: Breast Disposition:home with mother   03/14/2015 Cam HaiSHAW, KIMBERLY, CNM  7:47 AM

## 2015-03-14 NOTE — Discharge Instructions (Signed)

## 2015-03-14 NOTE — Lactation Note (Signed)
This note was copied from a baby's chart. Lactation Consultation Note  Patient Name: Catherine Le: 03/14/2015 Reason for consult: Follow-up assessment;Other (Comment) (3% weight loss , off photo tx , repeat Bili decreased )  Baby is being D/C today with mom and family. Per mom the baby has been getting bottles during the day when the  Baby isn't in to eating and I have put the baby to the breast at night and the baby has done well.  LC mentioned to mom if the baby is receiving a feeding from a bottle to work on increasing the amounts gradually, wake the baby  Up well , changing diaper, burping the baby ,feed skin to skin until the baby can stay awake for a feeding.  If breast feeding and latching , soften the 1st breast well before offering the 2nd breast and if the baby only feeds 1st breast, release  The 2nd breast to comfort. Sore nipple and engorgement prevention and tx reviewed.  LC discussed supply and demand and the importance of stimulation to both breast every 2 1/2 -3 hours with latching or pumping To establish and protect milk supply. [place] [facility] and the  Form didn't fax after 2 tries. LC will try again Sunday to fax form. Mom aware. [facility] And $30.oo cash received. LC instructed mom on the use and set up of the [facility] and mom had her pump pieces to pack.  Mother informed of post-discharge support and given phone number to the lactation department, including services for phone call assistance;  out-patient appointments; and breastfeeding support group. List of other breastfeeding resources in the community given in the handout. Encouraged  mother to call for problems or concerns related to breastfeeding.    Maternal Data    Feeding Feeding Type: Bottle Fed - Breast Milk  LATCH Score/Interventions                Intervention(s):  Breastfeeding basics reviewed     Lactation Tools Discussed/Used WIC Program: Yes ([place] [facility] )   Consult Status Consult Status: Follow-up    [name], [name] 03/14/2015, 4:53 PM

## 2015-03-15 LAB — TYPE AND SCREEN
ABO/RH(D): A POS
Antibody Screen: POSITIVE
DAT, IgG: NEGATIVE
UNIT DIVISION: 0
Unit division: 0

## 2015-04-09 ENCOUNTER — Ambulatory Visit (INDEPENDENT_AMBULATORY_CARE_PROVIDER_SITE_OTHER): Payer: Medicaid Other | Admitting: Advanced Practice Midwife

## 2015-04-09 ENCOUNTER — Encounter: Payer: Self-pay | Admitting: Advanced Practice Midwife

## 2015-04-09 NOTE — Progress Notes (Signed)
  Catherine Le is a 20 y.o. who presents for a postpartum visit. She is 4 weeks postpartum following a spontaneous vaginal delivery. I have fully reviewed the prenatal and intrapartum course. The delivery was at 39.2 gestational weeks.  Anesthesia: epidural. Postpartum course has been uneventful. Baby's course has been uneventful. Baby is feeding by breast (at night and morning only) and bottle. Wants to increase milk. . Bleeding: no bleeding. Bowel function is normal. Bladder function is normal. Patient is not sexually active. Contraception method is none. Postpartum depression screening: positive.(16).  C/O feeling overwhelmed.  Doesn't get along w/ mom very well.  Did not want to talk in front of mother--typed note on cell phone for me to read..  Denies SI/HI.  Counseling referral offered and accepted.    Current outpatient prescriptions:  .  ibuprofen (ADVIL,MOTRIN) 600 MG tablet, Take 1 tablet (600 mg total) by mouth every 6 (six) hours as needed., Disp: 30 tablet, Rfl: 0 .  Prenatal Vit-Fe Fumarate-FA (MULTIVITAMIN-PRENATAL) 27-0.8 MG TABS tablet, Take 1 tablet by mouth daily at 12 noon. Reported on 03/06/2015, Disp: , Rfl:   Review of Systems   Constitutional: Negative for fever and chills Eyes: Negative for visual disturbances Respiratory: Negative for shortness of breath, dyspnea Cardiovascular: Negative for chest pain or palpitations  Gastrointestinal: Negative for vomiting, diarrhea and constipation Genitourinary: Negative for dysuria and urgency Musculoskeletal: Negative for back pain, joint pain, myalgias  Neurological: Negative for dizziness and headaches   Objective:     Filed Vitals:   04/09/15 0842  BP: 120/80  Pulse: 84   General:  alert, cooperative and no distress   Breasts:  negative  Lungs: clear to auscultation bilaterally  Heart:  regular rate and rhythm  Abdomen: Soft, nontender   Vulva:  normal  Vagina: normal vagina  Cervix:  closed  Corpus: Well  involuted     Rectal Exam: no hemorrhoids        Assessment:    normal postpartum exam.  Plan:    1. Contraception: Nexplanon 2. Follow up in:  3 weeks for Nexplanon--no sex 3. Referral to [facility] made 4.  Feed more often and pump after feedings.  Get supplement that has fenugreek in it

## 2015-04-09 NOTE — Patient Instructions (Signed)
Mother's Tea or similar--active ingredient Fenugreek

## 2015-04-30 ENCOUNTER — Ambulatory Visit (INDEPENDENT_AMBULATORY_CARE_PROVIDER_SITE_OTHER): Payer: Medicaid Other | Admitting: Advanced Practice Midwife

## 2015-04-30 ENCOUNTER — Telehealth: Payer: Self-pay | Admitting: Advanced Practice Midwife

## 2015-04-30 ENCOUNTER — Encounter: Payer: Self-pay | Admitting: Advanced Practice Midwife

## 2015-04-30 VITALS — BP 124/80 | HR 66 | Ht 61.0 in | Wt 183.5 lb

## 2015-04-30 DIAGNOSIS — Z3202 Encounter for pregnancy test, result negative: Secondary | ICD-10-CM

## 2015-04-30 DIAGNOSIS — Z30017 Encounter for initial prescription of implantable subdermal contraceptive: Secondary | ICD-10-CM

## 2015-04-30 DIAGNOSIS — Z3049 Encounter for surveillance of other contraceptives: Secondary | ICD-10-CM | POA: Diagnosis not present

## 2015-04-30 LAB — POCT URINE PREGNANCY: Preg Test, Ur: NEGATIVE

## 2015-04-30 NOTE — Telephone Encounter (Signed)
I called pt because she has missed her appointment this morning and pt got upset when I told her she had to reschedule. I spoke with fran and she said it was okay. Pt now would like a call back From Drenda FreezeFran as soon as possible because she is upset.

## 2015-04-30 NOTE — Progress Notes (Signed)
  HPI:  Catherine Le is a 20 y.o. year old African American female here for Nexplanon insertion. She has not had sex since delivery, and her pregnancy test today was negative.  Risks/benefits/side effects of Nexplanon have been discussed and her questions have been answered.  Specifically, a failure rate of 01/998 has been reported, with an increased failure rate if pt takes St. John's Wort and/or antiseizure medicaitons.  Catherine Le is aware of the common side effect of irregular bleeding, which the incidence of decreases over time.   Past Medical History: Past Medical History  Diagnosis Date  . Headache     Past Surgical History: Past Surgical History  Procedure Laterality Date  . Tonsillectomy      Family History: Family History  Problem Relation Age of Onset  . Hypertension Father   . Asthma Father   . Hypertension Maternal Grandmother   . Cancer Maternal Grandmother     lung  . Hypertension Maternal Grandfather   . Cancer Maternal Aunt     breast    Social History: Social History  Substance Use Topics  . Smoking status: Never Smoker   . Smokeless tobacco: Never Used  . Alcohol Use: No    Allergies:  Allergies  Allergen Reactions  . Shrimp [Shellfish Allergy] Shortness Of Breath      Her left arm, approximatly 4 inches proximal from the elbow, was cleansed with alcohol and anesthetized with 2cc of 2% Lidocaine.  The area was cleansed again and the Nexplanon was inserted without difficulty.  A pressure bandage was applied.  Pt was instructed to remove pressure bandage in a few hours, and keep insertion site covered with a bandaid for 3 days.  Back up contraception was recommended for 2 weeks.  Follow-up scheduled PRN problems  CRESENZO-DISHMAN,Jniyah Dantuono 04/30/2015 9:50 AM

## 2015-05-25 ENCOUNTER — Telehealth: Payer: Self-pay | Admitting: Advanced Practice Midwife

## 2015-05-25 NOTE — Telephone Encounter (Signed)
Pt c/o vaginal bleeding with Nexplanon, requesting RX for Megace.

## 2015-05-26 ENCOUNTER — Other Ambulatory Visit: Payer: Self-pay | Admitting: Advanced Practice Midwife

## 2015-05-26 MED ORDER — MEGESTROL ACETATE 40 MG PO TABS: ORAL_TABLET | ORAL | Status: DC

## 2015-05-26 NOTE — Telephone Encounter (Signed)
rx sent

## 2015-06-16 ENCOUNTER — Ambulatory Visit (INDEPENDENT_AMBULATORY_CARE_PROVIDER_SITE_OTHER): Payer: Medicaid Other | Admitting: Adult Health

## 2015-06-16 ENCOUNTER — Encounter: Payer: Self-pay | Admitting: Adult Health

## 2015-06-16 VITALS — BP 110/76 | HR 88 | Ht 62.0 in | Wt 179.0 lb

## 2015-06-16 DIAGNOSIS — Z3202 Encounter for pregnancy test, result negative: Secondary | ICD-10-CM

## 2015-06-16 DIAGNOSIS — N926 Irregular menstruation, unspecified: Secondary | ICD-10-CM | POA: Diagnosis not present

## 2015-06-16 DIAGNOSIS — Z975 Presence of (intrauterine) contraceptive device: Secondary | ICD-10-CM

## 2015-06-16 DIAGNOSIS — R4589 Other symptoms and signs involving emotional state: Secondary | ICD-10-CM

## 2015-06-16 HISTORY — DX: Irregular menstruation, unspecified: N92.6

## 2015-06-16 HISTORY — DX: Other symptoms and signs involving emotional state: R45.89

## 2015-06-16 HISTORY — DX: Presence of (intrauterine) contraceptive device: Z97.5

## 2015-06-16 LAB — POCT URINE PREGNANCY: PREG TEST UR: NEGATIVE

## 2015-06-16 NOTE — Progress Notes (Signed)
Subjective:     Patient ID: Catherine Le, female   DOB: 1995/10/16, 20 y.o.   MRN: [id number]  HPI Catherine Le is a 20 year old black female in for a UPT, she had 3 +HPT and has been moody and easily teary.She had a vaginal delivery 04/07/15 and had nexplanon inserted 04/30/15.  Review of Systems +irregular bleeding  +moody and easily teary Reviewed past medical,surgical, social and family history. Reviewed medications and allergies.     Objective:   Physical Exam BP 110/76 mmHg  Pulse 88  Ht 5\' 2"  (1.575 m)  Wt 179 lb (81.194 kg)  BMI 32.73 kg/m2  LMP 05/19/2015  Breastfeeding? No UPT negative, nexplanon easily palpated left arm. [facility]    Assessment:     Irregular bleeding nexplanon in place [place] UPT negative     Plan:     Check QHCG, will talk in am Follow up prn

## 2015-06-16 NOTE — Patient Instructions (Signed)
Will talk in tomorrow

## 2015-06-17 ENCOUNTER — Telehealth: Payer: Self-pay | Admitting: Adult Health

## 2015-06-17 LAB — BETA HCG QUANT (REF LAB)

## 2015-06-17 NOTE — Telephone Encounter (Signed)
Left message that [facility] it is negative

## 2015-08-02 ENCOUNTER — Emergency Department (HOSPITAL_COMMUNITY)
Admission: EM | Admit: 2015-08-02 | Discharge: 2015-08-02 | Disposition: A | Discharge status: Home / Self Care | Payer: 59 | Attending: Emergency Medicine | Admitting: Emergency Medicine

## 2015-08-02 DIAGNOSIS — R197 Diarrhea, unspecified: Secondary | ICD-10-CM | POA: Insufficient documentation

## 2015-08-02 DIAGNOSIS — R112 Nausea with vomiting, unspecified: Secondary | ICD-10-CM

## 2015-08-02 DIAGNOSIS — N390 Urinary tract infection, site not specified: Secondary | ICD-10-CM | POA: Diagnosis not present

## 2015-08-02 LAB — CBC WITH DIFFERENTIAL/PLATELET
BASOS PCT: 0 %
Basophils Absolute: 0 10*3/uL (ref 0.0–0.1)
EOS PCT: 1 %
Eosinophils Absolute: 0.1 10*3/uL (ref 0.0–0.7)
HEMATOCRIT: 34.3 % — AB (ref 36.0–46.0)
Hemoglobin: 11.4 g/dL — ABNORMAL LOW (ref 12.0–15.0)
LYMPHS ABS: 3.3 10*3/uL (ref 0.7–4.0)
Lymphocytes Relative: 37 %
MCH: 29.2 pg (ref 26.0–34.0)
MCHC: 33.2 g/dL (ref 30.0–36.0)
MCV: 87.7 fL (ref 78.0–100.0)
MONOS PCT: 9 %
Monocytes Absolute: 0.8 10*3/uL (ref 0.1–1.0)
Neutro Abs: 4.6 10*3/uL (ref 1.7–7.7)
Neutrophils Relative %: 53 %
Platelets: 337 10*3/uL (ref 150–400)
RBC: 3.91 MIL/uL (ref 3.87–5.11)
RDW: 12.9 % (ref 11.5–15.5)
WBC: 8.8 10*3/uL (ref 4.0–10.5)

## 2015-08-02 LAB — LIPASE, BLOOD: LIPASE: 19 U/L (ref 11–51)

## 2015-08-02 LAB — COMPREHENSIVE METABOLIC PANEL
ALT: 17 U/L (ref 14–54)
AST: 23 U/L (ref 15–41)
Albumin: 3.9 g/dL (ref 3.5–5.0)
Alkaline Phosphatase: 60 U/L (ref 38–126)
Anion gap: 5 (ref 5–15)
BILIRUBIN TOTAL: 0.9 mg/dL (ref 0.3–1.2)
BUN: 10 mg/dL (ref 6–20)
CALCIUM: 8.5 mg/dL — AB (ref 8.9–10.3)
CO2: 23 mmol/L (ref 22–32)
CREATININE: 0.71 mg/dL (ref 0.44–1.00)
Chloride: 109 mmol/L (ref 101–111)
Glucose, Bld: 81 mg/dL (ref 65–99)
Potassium: 3.3 mmol/L — ABNORMAL LOW (ref 3.5–5.1)
Sodium: 137 mmol/L (ref 135–145)
TOTAL PROTEIN: 7.3 g/dL (ref 6.5–8.1)

## 2015-08-02 MED ORDER — ACETAMINOPHEN 500 MG PO TABS
1000.0000 mg | ORAL_TABLET | Freq: Once | ORAL | Status: AC
  Administered 2015-08-02: 1000 mg via ORAL
  Filled 2015-08-02: qty 2

## 2015-08-02 MED ORDER — LOPERAMIDE HCL 2 MG PO CAPS: 2.0000 mg | ORAL_CAPSULE | Freq: Four times a day (QID) | ORAL | 0 refills | Status: DC | PRN

## 2015-08-02 MED ORDER — CEPHALEXIN 500 MG PO CAPS
500.0000 mg | ORAL_CAPSULE | Freq: Four times a day (QID) | ORAL | 0 refills | Status: DC
Start: 2015-08-02 — End: 2016-05-26

## 2015-08-02 MED ORDER — POTASSIUM CHLORIDE CRYS ER 20 MEQ PO TBCR
40.0000 meq | EXTENDED_RELEASE_TABLET | Freq: Once | ORAL | Status: AC
  Administered 2015-08-02: 40 meq via ORAL
  Filled 2015-08-02: qty 2

## 2015-08-02 MED ORDER — LOPERAMIDE HCL 2 MG PO CAPS
4.0000 mg | ORAL_CAPSULE | Freq: Once | ORAL | Status: AC
  Administered 2015-08-02: 4 mg via ORAL
  Filled 2015-08-02: qty 2

## 2015-08-02 MED ORDER — SODIUM CHLORIDE 0.9 % IV BOLUS (SEPSIS)
1000.0000 mL | Freq: Once | INTRAVENOUS | Status: AC
  Administered 2015-08-02: 1000 mL via INTRAVENOUS

## 2015-08-02 MED ORDER — PROMETHAZINE HCL 25 MG PO TABS: 25.0000 mg | ORAL_TABLET | Freq: Four times a day (QID) | ORAL | 0 refills | Status: DC | PRN

## 2015-08-02 MED ORDER — CEFTRIAXONE SODIUM 1 G IJ SOLR
1.0000 g | Freq: Once | INTRAMUSCULAR | Status: AC
  Administered 2015-08-02: 1 g via INTRAVENOUS
  Filled 2015-08-02: qty 10

## 2015-08-02 MED ORDER — ONDANSETRON 4 MG PO TBDP: 4.0000 mg | ORAL_TABLET | Freq: Three times a day (TID) | ORAL | 0 refills | Status: DC | PRN

## 2015-08-02 MED ORDER — ONDANSETRON HCL 4 MG/2ML IJ SOLN
4.0000 mg | Freq: Once | INTRAMUSCULAR | Status: AC
  Administered 2015-08-02: 4 mg via INTRAVENOUS
  Filled 2015-08-02: qty 2

## 2015-08-02 NOTE — ED Provider Notes (Signed)
TIME SEEN: 5:00 AM  CHIEF COMPLAINT: Nausea, vomiting, diarrhea, dysuria  HPI: Pt is a 20 y.o. female with no significant past medical history who presents to the emergency department with complaints of nausea, vomiting, diarrhea that started this morning. Has had 2-3 days of dysuria as well as. Denies fevers, chills, abdominal pain, back pain. No abnormal vaginal bleeding or discharge. No hematuria. No history of abdominal surgeries. No recent sick contacts, travel, and medic use or hospitalization. She denies any aggravating or relieving factors. States she's had 7 episodes of vomiting this morning and one episode of nonbloody diarrhea.  ROS: See HPI Constitutional: no fever  Eyes: no drainage  ENT: no runny nose   Cardiovascular:  no chest pain  Resp: no SOB  GI:  vomiting GU:  dysuria Integumentary: no rash  Allergy: no hives  Musculoskeletal: no leg swelling  Neurological: no slurred speech ROS otherwise negative  PAST MEDICAL HISTORY/PAST SURGICAL HISTORY:  Past Medical History:  Diagnosis Date  . Headache   . Irregular bleeding 06/16/2015  . Moody 06/16/2015  . Nexplanon in place 06/16/2015    MEDICATIONS:  Prior to Admission medications   Medication Sig Start Date End Date Taking? Authorizing Provider  etonogestrel (NEXPLANON) 68 MG IMPL implant 1 each by Subdermal route once.    Historical Provider, MD    ALLERGIES:  Allergies  Allergen Reactions  . Shrimp [Shellfish Allergy] Shortness Of Breath    SOCIAL HISTORY:  Social History  Substance Use Topics  . Smoking status: Never Smoker  . Smokeless tobacco: Never Used  . Alcohol use No    FAMILY HISTORY: Family History  Problem Relation Age of Onset  . Hypertension Father   . Asthma Father   . Hypertension Maternal Grandmother   . Cancer Maternal Grandmother     lung  . Hypertension Maternal Grandfather   . Cancer Maternal Aunt     breast    EXAM: VITALS:  132/92, HR 97, RR 18, Temp 99, Sat 99% on  RA CONSTITUTIONAL: Alert and oriented and responds appropriately to questions. Well-appearing; well-nourished HEAD: Normocephalic EYES: Conjunctivae clear, PERRL ENT: normal nose; no rhinorrhea; moist mucous membranes NECK: Supple, no meningismus, no LAD  CARD: RRR; S1 and S2 appreciated; no murmurs, no clicks, no rubs, no gallops RESP: Normal chest excursion without splinting or tachypnea; breath sounds clear and equal bilaterally; no wheezes, no rhonchi, no rales, no hypoxia or respiratory distress, speaking full sentences ABD/GI: Normal bowel sounds; non-distended; soft, non-tender, no rebound, no guarding, no peritoneal signs BACK:  The back appears normal and is non-tender to palpation, there is no CVA tenderness EXT: Normal ROM in all joints; non-tender to palpation; no edema; normal capillary refill; no cyanosis, no calf tenderness or swelling    SKIN: Normal color for age and race; warm; no rash NEURO: Moves all extremities equally, sensation to light touch intact diffusely, cranial nerves II through XII intact PSYCH: The patient's mood and manner are appropriate. Grooming and personal hygiene are appropriate.  MEDICAL DECISION MAKING: Patient here with nausea, vomiting and diarrhea. Also has dysuria. Suspect viral illness, UTI. Abdominal exam is completely benign. Low suspicion for appendicitis, colitis, diverticulitis, bowel obstruction, pancreatitis, cholecystitis. This time I do not feel she needs emergent imaging. Labs, urine pending. We'll give IV fluids, Zofran, Imodium. She does report a mild diffuse headache. We'll give Tylenol for her headache.  ED PROGRESS: Patient's urinalysis shows moderate ketones, negative nitrites, moderate leukocytes, large amount of blood, many bacteria and 0-5  squamous cells. Suspect urinary tract infection. Will send culture and give ceftriaxone. Her urine pregnancy test is negative.  Patient reports feeling much better. We'll fluid challenge. Labs are  unremarkable other than a potassium level of 3.1 each we will give oral placement for.   Patient tolerating by mouth. Will discharge home. We'll give outpatient PCP follow-up information. We'll discharge with Zofran, Imodium, Keflex.   Layla Maw Ward, DO 08/02/15 [phone]

## 2015-08-02 NOTE — Discharge Instructions (Signed)
To find a primary care or specialty doctor please call [phone] or [phone] to access "[facility] Find a Doctor Service." ° °You may also go on the [facility] website at [email or web address] ° °There are also multiple [facility] and [facility] practices throughout the [place] that are frequently accepting new patients. You may find a clinic that is close to your home and contact them. ° °[facility] -  °[address] °[place] [address] °[phone] ° °[facility] in [place] (also locations in [place] and ) -  °[address] °[place] [address] °[phone] ° °[facility] -  °[address] °[place] [address] °[phone] ° ° °

## 2015-08-02 NOTE — ED Notes (Signed)
Patient with no complaints at this time. Respirations even and unlabored. Skin warm/dry. Discharge instructions reviewed with patient at this time. Patient given opportunity to voice concerns/ask questions. IV removed per policy and band-aid applied to site. Patient discharged at this time and left Emergency Department with steady gait.  

## 2015-08-02 NOTE — ED Notes (Signed)
Blood hemolysised. Phelb in to redraw labs. Pt refusing to be stick in her left arm because of her "birth control implant". IVF stopped by myself so phelb can stick right arm.

## 2015-08-04 LAB — URINALYSIS, ROUTINE W REFLEX MICROSCOPIC
GLUCOSE, UA: NEGATIVE mg/dL
Ketones, ur: 40 mg/dL — AB
Nitrite: NEGATIVE
PH: 6 (ref 5.0–8.0)
Protein, ur: 100 mg/dL — AB

## 2015-08-04 LAB — URINE MICROSCOPIC-ADD ON

## 2015-08-04 LAB — PREGNANCY, URINE: PREG TEST UR: NEGATIVE

## 2015-08-05 LAB — URINE CULTURE: Culture: >=100000 — AB

## 2015-08-06 ENCOUNTER — Telehealth: Payer: Self-pay | Admitting: *Deleted

## 2015-08-06 NOTE — Telephone Encounter (Signed)
Post ED Visit - Positive Culture Follow-up  Culture report reviewed by antimicrobial stewardship pharmacist:  [x]  Enzo Bi, Pharm.D. []  Celedonio Miyamoto, Pharm.D., BCPS []  Garvin Fila, Pharm.D. []  Georgina Pillion, [address].D., BCPS []  Sharon, [place].D., BCPS, AAHIVP []  Estella Husk, Pharm.D., BCPS, AAHIVP []  Tennis Must, Pharm.D. []  Rob Oswaldo Done, [address].D.  Positive urine culture Treated with Cephalexin, organism sensitive to the same and no further patient follow-up is required at this time.  Virl Axe [facility] 08/06/2015, 10:20 AM

## 2015-08-27 ENCOUNTER — Encounter (HOSPITAL_COMMUNITY): Payer: Self-pay | Admitting: *Deleted

## 2015-08-27 ENCOUNTER — Emergency Department (HOSPITAL_COMMUNITY): Payer: 59

## 2015-08-27 ENCOUNTER — Emergency Department (HOSPITAL_COMMUNITY)
Admission: EM | Admit: 2015-08-27 | Discharge: 2015-08-27 | Disposition: A | Discharge status: Home / Self Care | Payer: 59 | Attending: Emergency Medicine | Admitting: Emergency Medicine

## 2015-08-27 DIAGNOSIS — Y999 Unspecified external cause status: Secondary | ICD-10-CM | POA: Insufficient documentation

## 2015-08-27 DIAGNOSIS — Z79899 Other long term (current) drug therapy: Secondary | ICD-10-CM | POA: Insufficient documentation

## 2015-08-27 DIAGNOSIS — Y92410 Unspecified street and highway as the place of occurrence of the external cause: Secondary | ICD-10-CM | POA: Diagnosis not present

## 2015-08-27 DIAGNOSIS — S8001XA Contusion of right knee, initial encounter: Secondary | ICD-10-CM | POA: Diagnosis not present

## 2015-08-27 DIAGNOSIS — Y939 Activity, unspecified: Secondary | ICD-10-CM | POA: Diagnosis not present

## 2015-08-27 DIAGNOSIS — S8991XA Unspecified injury of right lower leg, initial encounter: Secondary | ICD-10-CM | POA: Diagnosis present

## 2015-08-27 MED ORDER — CYCLOBENZAPRINE HCL 10 MG PO TABS
10.0000 mg | ORAL_TABLET | Freq: Once | ORAL | Status: AC
  Administered 2015-08-27: 10 mg via ORAL
  Filled 2015-08-27: qty 1

## 2015-08-27 MED ORDER — CYCLOBENZAPRINE HCL 5 MG PO TABS: 10.0000 mg | ORAL_TABLET | Freq: Three times a day (TID) | ORAL | 0 refills | Status: DC | PRN

## 2015-08-27 MED ORDER — IBUPROFEN 800 MG PO TABS
800.0000 mg | ORAL_TABLET | Freq: Once | ORAL | Status: AC
  Administered 2015-08-27: 800 mg via ORAL
  Filled 2015-08-27: qty 1

## 2015-08-27 MED ORDER — IBUPROFEN 800 MG PO TABS: 800.0000 mg | ORAL_TABLET | Freq: Three times a day (TID) | ORAL | 0 refills | Status: DC
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
## 2015-08-27 NOTE — ED Notes (Signed)
Pt alert & oriented x4, stable gait. Patient given discharge instructions, paperwork & prescription(s). Patient  instructed to stop at the registration desk to finish any additional paperwork. Patient verbalized understanding. Pt left department w/ no further questions. 

## 2015-08-27 NOTE — Discharge Instructions (Signed)
Apply ice packs on/off to your knee.  Wear the ace wrap as needed when weight bearing.  Do not wear it continuously or to bed.  Call Dr. Mort SawyersHarrison's office to arrange a follow-up appt if not improving
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
## 2015-08-29 ENCOUNTER — Emergency Department (HOSPITAL_COMMUNITY)
Admission: EM | Admit: 2015-08-29 | Discharge: 2015-08-29 | Disposition: A | Discharge status: Home / Self Care | Payer: 59 | Attending: Emergency Medicine | Admitting: Emergency Medicine

## 2015-08-29 ENCOUNTER — Emergency Department (HOSPITAL_COMMUNITY): Payer: 59

## 2015-08-29 ENCOUNTER — Encounter (HOSPITAL_COMMUNITY): Payer: Self-pay | Admitting: *Deleted

## 2015-08-29 DIAGNOSIS — S39012D Strain of muscle, fascia and tendon of lower back, subsequent encounter: Secondary | ICD-10-CM

## 2015-08-29 DIAGNOSIS — M545 Low back pain: Secondary | ICD-10-CM | POA: Diagnosis present

## 2015-08-29 DIAGNOSIS — Z79899 Other long term (current) drug therapy: Secondary | ICD-10-CM | POA: Diagnosis not present

## 2015-08-29 LAB — POC URINE PREG, ED: PREG TEST UR: NEGATIVE

## 2015-08-29 MED ORDER — TRAMADOL HCL 50 MG PO TABS: 50.0000 mg | ORAL_TABLET | Freq: Four times a day (QID) | ORAL | 0 refills | Status: DC | PRN
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
## 2015-08-29 NOTE — Discharge Instructions (Signed)
Continue ice on/off to your knee and back.  Call Dr. Mort SawyersHarrison's office to arrange a follow-up appt in one week if not improving
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
## 2015-10-05 ENCOUNTER — Telehealth: Payer: Self-pay | Admitting: Advanced Practice Midwife

## 2015-10-05 MED ORDER — MEGESTROL ACETATE 40 MG PO TABS: 40.0000 mg | ORAL_TABLET | Freq: Every day | ORAL | 0 refills | Status: DC

## 2015-10-05 NOTE — Telephone Encounter (Signed)
Spoke with pt. Pt has been taking Megace for bleeding. Drenda FreezeFran prescribed Megace. Pt is taking 3 daily currently because her bleeding started back. Can you refill Megace? Thanks!! JSY

## 2015-10-05 NOTE — Telephone Encounter (Signed)
Will refill megace 

## 2015-10-14 ENCOUNTER — Telehealth: Payer: Self-pay | Admitting: Advanced Practice Midwife

## 2015-10-14 NOTE — Telephone Encounter (Signed)
Pt states called last week to request a dose change on her Megace but the exact same RX was sent to her pharmacy.  After talking with pt she actually is not wanting a dose change but a change in the quantity of pills sent in, she states that every time she drops down to one pill a day she starts bleeding heavy again and blood soaks though her clothes and is having to leave work so she is wanting a greater quantity sent in so she does not run out of pill so quickly.  Please advise.

## 2015-10-15 NOTE — Telephone Encounter (Signed)
Pt states would prefer to see Cathie BeamsFran Cresenzo-Dishmon, CNM, appt scheduled for 10/20/2015.

## 2015-10-20 ENCOUNTER — Encounter: Payer: Self-pay | Admitting: Advanced Practice Midwife

## 2015-10-20 ENCOUNTER — Ambulatory Visit (INDEPENDENT_AMBULATORY_CARE_PROVIDER_SITE_OTHER): Payer: 59 | Admitting: Advanced Practice Midwife

## 2015-10-20 VITALS — BP 112/66 | HR 80 | Wt 190.0 lb

## 2015-10-20 DIAGNOSIS — N921 Excessive and frequent menstruation with irregular cycle: Secondary | ICD-10-CM

## 2015-10-20 DIAGNOSIS — Z975 Presence of (intrauterine) contraceptive device: Principal | ICD-10-CM

## 2015-10-20 MED ORDER — MEGESTROL ACETATE 40 MG PO TABS: 40.0000 mg | ORAL_TABLET | Freq: Every day | ORAL | 1 refills | Status: DC

## 2015-10-20 NOTE — Progress Notes (Signed)
Family [facility] Clinic Visit  Patient name: Catherine Le MRN [id number]  Date of birth: 14-May-1995  CC & HPI:  TAFFY DELCONTE is a 20 y.o. African American female presenting today for DUB on Nexplanon. Has had it nearly 6 months, bleeds if she gets below 80mg /day of megace.    Pertinent History Reviewed:  Medical & Surgical Hx:   Past Medical History:  Diagnosis Date  . Headache   . Irregular bleeding 06/16/2015  . Moody 06/16/2015  . Nexplanon in place 06/16/2015   Past Surgical History:  Procedure Laterality Date  . TONSILLECTOMY     Family History  Problem Relation Age of Onset  . Hypertension Father   . Asthma Father   . Hypertension Maternal Grandmother   . Cancer Maternal Grandmother     lung  . Hypertension Maternal Grandfather   . Cancer Maternal Aunt     breast    Current Outpatient Prescriptions:  .  cephALEXin (KEFLEX) 500 MG capsule, Take 1 capsule (500 mg total) by mouth 4 (four) times daily. (Patient not taking: Reported on 10/20/2015), Disp: 28 capsule, Rfl: 0 .  cyclobenzaprine (FLEXERIL) 5 MG tablet, Take 2 tablets (10 mg total) by mouth 3 (three) times daily as needed. (Patient not taking: Reported on 10/20/2015), Disp: 21 tablet, Rfl: 0 .  etonogestrel (NEXPLANON) 68 MG IMPL implant, 1 each by Subdermal route once., Disp: , Rfl:  .  ibuprofen (ADVIL,MOTRIN) 800 MG tablet, Take 1 tablet (800 mg total) by mouth 3 (three) times daily. (Patient not taking: Reported on 10/20/2015), Disp: 21 tablet, Rfl: 0 .  loperamide (IMODIUM) 2 MG capsule, Take 1 capsule (2 mg total) by mouth 4 (four) times daily as needed for diarrhea or loose stools. (Patient not taking: Reported on 10/20/2015), Disp: 12 capsule, Rfl: 0 .  megestrol (MEGACE) 40 MG tablet, Take 1 tablet (40 mg total) by mouth daily. May increase to 2-3/day prn bleeding, Disp: 90 tablet, Rfl: 1 .  ondansetron (ZOFRAN ODT) 4 MG disintegrating tablet, Take 1 tablet (4 mg total) by mouth every 8 (eight) hours as  needed for nausea or vomiting. (Patient not taking: Reported on 10/20/2015), Disp: 20 tablet, Rfl: 0 .  promethazine (PHENERGAN) 25 MG tablet, Take 1 tablet (25 mg total) by mouth every 6 (six) hours as needed for nausea or vomiting. (Patient not taking: Reported on 10/20/2015), Disp: 15 tablet, Rfl: 0 .  traMADol (ULTRAM) 50 MG tablet, Take 1 tablet (50 mg total) by mouth every 6 (six) hours as needed. (Patient not taking: Reported on 10/20/2015), Disp: 15 tablet, Rfl: 0 Social History: Reviewed -  reports that she has never smoked. She has never used smokeless tobacco.  Review of Systems:   Constitutional: Negative for fever and chills Eyes: Negative for visual disturbances Respiratory: Negative for shortness of breath, dyspnea Cardiovascular: Negative for chest pain or palpitations  Gastrointestinal: Negative for vomiting, diarrhea and constipation; no abdominal pain Genitourinary: Negative for dysuria and urgency, vaginal irritation or itching Musculoskeletal: Negative for back pain, joint pain, myalgias  Neurological: Negative for dizziness and headaches    Objective Findings:    Physical Examination: General appearance - well appearing, and in no distress Mental status - alert, oriented to person, place, and time Chest:  Normal respiratory effort Heart - normal rate and regular rhythm Abdomen:  Soft, nontender Musculoskeletal:  Normal range of motion without pain Extremities:  No edema    No results found for this or any previous visit (from the  past 24 hour(s)).    Assessment & Plan:  A:   DUB on Nexplanon P:  Megace 80mg /day for 2 mo nths maximum.  If still DUB then, consider changing BC   Return if symptoms worsen or fail to improve.  CRESENZO-DISHMAN,Tallin Hart CNM 10/20/2015 2:29 PM

## 2016-02-25 ENCOUNTER — Telehealth: Payer: Self-pay | Admitting: Advanced Practice Midwife

## 2016-02-25 NOTE — Telephone Encounter (Signed)
Patient called stating she has not had any bleeding with her Nexplanon since October but today has had very heavy bleeding. She is soaking through 2 pads an hour and through her pants. She is concerned and doesn't know if she needs to take the Megace again or be seen to have Nexplanon removed. Please advise.

## 2016-02-25 NOTE — Telephone Encounter (Signed)
Take the megace. Take 3 today ([facility] and for the next 3 days ,then 2/day for 2 days then 1/day until bleeding stops. If bleeding doesn't start slow down by tomorrow let us know  Let me know if she needs a nother rx

## 2016-02-25 NOTE — Telephone Encounter (Signed)
Informed patient to try taking Megace again and if bleeding does not slow down by tomorrow, to give us a call back. Pt verbalized understanding.

## 2016-05-24 ENCOUNTER — Telehealth: Payer: Self-pay | Admitting: Obstetrics & Gynecology

## 2016-05-24 ENCOUNTER — Other Ambulatory Visit: Payer: 59

## 2016-05-24 DIAGNOSIS — N390 Urinary tract infection, site not specified: Secondary | ICD-10-CM

## 2016-05-24 NOTE — Telephone Encounter (Signed)
Pt called stating that she might have a UTI and would like to know if wee could refill the medication she had last time for it. Please contact pt

## 2016-05-24 NOTE — Telephone Encounter (Signed)
Patient called with complaints of burning with urination and lower abdominal pain for a day or so. She is unable to come for appointment tomorrow due to job orientation starting tomorrow. Can patient come and leave urine specimen? Please advise.

## 2016-05-24 NOTE — Telephone Encounter (Signed)
Informed patient she could come to leave urine culture. Patient stated she will come now.

## 2016-05-26 ENCOUNTER — Other Ambulatory Visit: Payer: Self-pay | Admitting: Obstetrics & Gynecology

## 2016-05-26 ENCOUNTER — Telehealth: Payer: Self-pay | Admitting: Women's Health

## 2016-05-26 MED ORDER — CEPHALEXIN 500 MG PO CAPS: 500.0000 mg | ORAL_CAPSULE | Freq: Four times a day (QID) | ORAL | 0 refills | Status: DC

## 2016-05-27 ENCOUNTER — Telehealth: Payer: Self-pay | Admitting: Adult Health

## 2016-05-27 ENCOUNTER — Telehealth: Payer: Self-pay | Admitting: *Deleted

## 2016-05-27 LAB — URINE CULTURE

## 2016-05-27 NOTE — Telephone Encounter (Signed)
Patient called stating after she took the Keflex she felt nauseated and threw up. Her mom thinks she is having problems with her kidneys. Advised patient that her urine was positive for a UTI and needs to be treated. If after taking all the Keflex she is still having symptoms to give us a call back too be seen. Also advised not to take meds on empty stomach and to drink plenty of water with it. Verbalized understanding.

## 2016-05-27 NOTE — Telephone Encounter (Signed)
LMOVM

## 2016-05-27 NOTE — Telephone Encounter (Signed)
Urine culture + E COLI, Dr Despina HiddenEure sent Rx for Keflex to [facility] 05/26/16

## 2016-05-27 NOTE — Telephone Encounter (Signed)
Informed [facility]

## 2016-06-20 ENCOUNTER — Telehealth: Payer: Self-pay | Admitting: Advanced Practice Midwife

## 2016-06-20 ENCOUNTER — Other Ambulatory Visit: Payer: Self-pay | Admitting: Obstetrics & Gynecology

## 2016-06-20 MED ORDER — MEGESTROL ACETATE 40 MG PO TABS: 40.0000 mg | ORAL_TABLET | Freq: Every day | ORAL | 1 refills | Status: DC

## 2016-06-20 NOTE — Telephone Encounter (Signed)
Attempted to call patient to inform of refill but no answer.

## 2016-06-20 NOTE — Telephone Encounter (Signed)
Pt called stating that she tal;ked to her pharmacy to request a refill on her medication and the pharmacy told her to call our office to speak with a nurse. Please contact pt

## 2016-09-20 ENCOUNTER — Emergency Department (HOSPITAL_COMMUNITY)
Admission: EM | Admit: 2016-09-20 | Discharge: 2016-09-20 | Disposition: A | Discharge status: Home / Self Care | Payer: 59 | Attending: Emergency Medicine | Admitting: Emergency Medicine

## 2016-09-20 ENCOUNTER — Emergency Department (HOSPITAL_COMMUNITY): Payer: 59

## 2016-09-20 ENCOUNTER — Encounter (HOSPITAL_COMMUNITY): Payer: Self-pay | Admitting: Emergency Medicine

## 2016-09-20 DIAGNOSIS — Z79899 Other long term (current) drug therapy: Secondary | ICD-10-CM | POA: Diagnosis not present

## 2016-09-20 DIAGNOSIS — R079 Chest pain, unspecified: Secondary | ICD-10-CM | POA: Diagnosis not present

## 2016-09-20 DIAGNOSIS — F439 Reaction to severe stress, unspecified: Secondary | ICD-10-CM | POA: Diagnosis not present

## 2016-09-20 LAB — BASIC METABOLIC PANEL
ANION GAP: 12 (ref 5–15)
BUN: 10 mg/dL (ref 6–20)
CALCIUM: 9.5 mg/dL (ref 8.9–10.3)
CHLORIDE: 104 mmol/L (ref 101–111)
CO2: 21 mmol/L — ABNORMAL LOW (ref 22–32)
CREATININE: 0.73 mg/dL (ref 0.44–1.00)
GFR calc non Af Amer: >60 mL/min (ref >60)
Glucose, Bld: 90 mg/dL (ref 65–99)
Potassium: 3.2 mmol/L — ABNORMAL LOW (ref 3.5–5.1)
SODIUM: 137 mmol/L (ref 135–145)

## 2016-09-20 LAB — CBC
HCT: 36.3 % (ref 36.0–46.0)
HEMOGLOBIN: 12.2 g/dL (ref 12.0–15.0)
MCH: 28.5 pg (ref 26.0–34.0)
MCHC: 33.6 g/dL (ref 30.0–36.0)
MCV: 84.8 fL (ref 78.0–100.0)
PLATELETS: 372 10*3/uL (ref 150–400)
RBC: 4.28 MIL/uL (ref 3.87–5.11)
RDW: 14 % (ref 11.5–15.5)
WBC: 17 10*3/uL — AB (ref 4.0–10.5)

## 2016-09-20 LAB — POCT PREGNANCY, URINE: Preg Test, Ur: NEGATIVE

## 2016-09-20 LAB — POCT I-STAT TROPONIN I: Troponin i, poc: 0 ng/mL (ref 0.00–0.08)
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
## 2016-09-20 NOTE — ED Triage Notes (Signed)
Pt reports chest pain that started appx 30 min prior to arrival. Pt states pain feels like a pressure. No prior cardiac hx.
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
## 2017-02-09 ENCOUNTER — Other Ambulatory Visit: Payer: Self-pay | Admitting: Advanced Practice Midwife

## 2017-02-09 ENCOUNTER — Telehealth: Payer: Self-pay | Admitting: *Deleted

## 2017-02-09 MED ORDER — MEGESTROL ACETATE 40 MG PO TABS: 40.0000 mg | ORAL_TABLET | Freq: Every day | ORAL | 1 refills | Status: DC

## 2017-02-09 NOTE — Telephone Encounter (Signed)
Patient came into the office requesting refill for Megace. States she does not take it daily only when needed and is still having irregular heavy bleeding since having the Nexplanon placed on 04/30/15. She has made an appointment to talk about the bleeding but would like a refill until then. Please advsie.

## 2017-02-16 ENCOUNTER — Ambulatory Visit: Payer: Self-pay | Admitting: Adult Health

## 2017-03-09 ENCOUNTER — Encounter: Payer: Self-pay | Admitting: Adult Health

## 2017-03-09 ENCOUNTER — Encounter (INDEPENDENT_AMBULATORY_CARE_PROVIDER_SITE_OTHER): Payer: Self-pay

## 2017-03-09 ENCOUNTER — Ambulatory Visit (INDEPENDENT_AMBULATORY_CARE_PROVIDER_SITE_OTHER): Payer: 59 | Admitting: Adult Health

## 2017-03-09 VITALS — BP 120/80 | HR 97 | Ht 67.0 in | Wt 222.4 lb

## 2017-03-09 DIAGNOSIS — Z113 Encounter for screening for infections with a predominantly sexual mode of transmission: Secondary | ICD-10-CM

## 2017-03-09 DIAGNOSIS — N921 Excessive and frequent menstruation with irregular cycle: Secondary | ICD-10-CM

## 2017-03-09 DIAGNOSIS — Z975 Presence of (intrauterine) contraceptive device: Secondary | ICD-10-CM

## 2017-03-09 DIAGNOSIS — N926 Irregular menstruation, unspecified: Secondary | ICD-10-CM

## 2017-03-09 NOTE — Progress Notes (Signed)
Subjective:     Patient ID: Catherine Le, female   DOB: [birth year], 22 y.o.   MRN: [id number]  HPI Catherine Le is a 22 year old black female in complaining of BTB and irregular bleeding with nexplanon.Had nexplanon inserted 04/30/15. She has used megace in the past.   Review of Systems BTB with nexplanon Irregular bleeding Bleeding stopped yesterday No sex in months   Reviewed past medical,surgical, social and family history. Reviewed medications and allergies.     Objective:   Physical Exam BP 120/80 (BP Location: Right Arm, Patient Position: Sitting, Cuff Size: Small)   Pulse 97   Ht 5\' 7"  (1.702 m)   Wt 222 lb 6.4 oz (100.9 kg)   LMP 02/20/2017   BMI 34.83 kg/m  Skin warm and dry.Pelvic: external genitalia is normal in appearance no lesions, vagina: white discharge without odor,urethra has no lesions or masses noted, cervix:smooth and bulbous, uterus: normal size, shape and contour, non tender, no masses felt, adnexa: no masses or tenderness noted. Bladder is non tender and no masses felt.  GC/CHL obtained.  Nexplanon palpated in left arm.     Assessment:     1. Breakthrough bleeding on Nexplanon   2. Irregular bleeding   3. Nexplanon in place   4. Screening examination for STD (sexually transmitted disease)       Plan:     GC/CHL sent Return in 5 weeks for pap and physical  Call if bleeding returns,and will rx megace.

## 2017-03-11 LAB — GC/CHLAMYDIA PROBE AMP
Chlamydia trachomatis, NAA: NEGATIVE
Neisseria gonorrhoeae by PCR: NEGATIVE

## 2017-03-23 ENCOUNTER — Encounter (HOSPITAL_COMMUNITY): Payer: Self-pay | Admitting: *Deleted

## 2017-03-23 ENCOUNTER — Emergency Department (HOSPITAL_COMMUNITY)
Admission: EM | Admit: 2017-03-23 | Discharge: 2017-03-23 | Disposition: A | Discharge status: Home / Self Care | Payer: 59 | Attending: Emergency Medicine | Admitting: Emergency Medicine

## 2017-03-23 ENCOUNTER — Emergency Department (HOSPITAL_COMMUNITY): Payer: 59

## 2017-03-23 DIAGNOSIS — Z79899 Other long term (current) drug therapy: Secondary | ICD-10-CM | POA: Diagnosis not present

## 2017-03-23 DIAGNOSIS — R600 Localized edema: Secondary | ICD-10-CM

## 2017-03-23 DIAGNOSIS — M79671 Pain in right foot: Secondary | ICD-10-CM

## 2017-03-23 LAB — CBC WITH DIFFERENTIAL/PLATELET
Basophils Absolute: 0 10*3/uL (ref 0.0–0.1)
Basophils Relative: 0 %
EOS PCT: 1 %
Eosinophils Absolute: 0.1 10*3/uL (ref 0.0–0.7)
HCT: 36.8 % (ref 36.0–46.0)
Hemoglobin: 11.4 g/dL — ABNORMAL LOW (ref 12.0–15.0)
LYMPHS ABS: 3.4 10*3/uL (ref 0.7–4.0)
Lymphocytes Relative: 30 %
MCH: 27.7 pg (ref 26.0–34.0)
MCHC: 31 g/dL (ref 30.0–36.0)
MCV: 89.3 fL (ref 78.0–100.0)
MONOS PCT: 9 %
Monocytes Absolute: 1 10*3/uL (ref 0.1–1.0)
Neutro Abs: 6.8 10*3/uL (ref 1.7–7.7)
Neutrophils Relative %: 60 %
PLATELETS: 393 10*3/uL (ref 150–400)
RBC: 4.12 MIL/uL (ref 3.87–5.11)
RDW: 14.5 % (ref 11.5–15.5)
WBC: 11.3 10*3/uL — AB (ref 4.0–10.5)

## 2017-03-23 LAB — BASIC METABOLIC PANEL
Anion gap: 10 (ref 5–15)
BUN: 16 mg/dL (ref 6–20)
CO2: 23 mmol/L (ref 22–32)
Calcium: 9.2 mg/dL (ref 8.9–10.3)
Chloride: 105 mmol/L (ref 101–111)
Creatinine, Ser: 0.78 mg/dL (ref 0.44–1.00)
GFR calc Af Amer: >60 mL/min (ref >60)
GFR calc non Af Amer: >60 mL/min (ref >60)
Glucose, Bld: 86 mg/dL (ref 65–99)
POTASSIUM: 3.5 mmol/L (ref 3.5–5.1)
Sodium: 138 mmol/L (ref 135–145)

## 2017-03-23 MED ORDER — IBUPROFEN 600 MG PO TABS: 600.0000 mg | ORAL_TABLET | Freq: Three times a day (TID) | ORAL | 0 refills | Status: DC | PRN

## 2017-03-23 MED ORDER — IBUPROFEN 800 MG PO TABS
800.0000 mg | ORAL_TABLET | Freq: Once | ORAL | Status: AC
  Administered 2017-03-23: 800 mg via ORAL
  Filled 2017-03-23: qty 1

## 2017-03-23 NOTE — ED Triage Notes (Signed)
Pt with pedal pulse noted in right foot.

## 2017-03-23 NOTE — ED Notes (Signed)
Pt states swelling to her right foot x 3 days. Denies any injury, "just woke up like that."

## 2017-03-23 NOTE — Discharge Instructions (Signed)
It is unclear currently the cause of the pain and swelling in your right foot and ankle but you need to have an ultrasound of the leg to make sure a blood clot is not the source of your symptoms.  Call in the morning as discussed to schedule an appointment time for this test. Elevate and apply a warm compress or heating pad  to help with pain and swelling.  You will be advised tomorrow of any further treatment plan if your ultrasound is positive for a blood clot.  If it is negative, continue using elevation and anti inflammatory medicines such as prescribed.

## 2017-03-23 NOTE — ED Notes (Signed)
Pt alert & oriented x4, stable gait. Patient given discharge instructions, paperwork & prescription(s). Patient  instructed to stop at the registration desk to finish any additional paperwork. Patient verbalized understanding. Pt left department w/ no further questions. 

## 2017-03-23 NOTE — ED Triage Notes (Signed)
Pt with right foot swelling for past 3-4 days. Pt denies any injury.  Pt has tried elevating when at home but she is on her feet all day at work.

## 2017-03-24 NOTE — ED Provider Notes (Signed)
[facility] EMERGENCY DEPARTMENT Provider Note   CSN: [id number] Arrival date & time: 03/23/17  1927     History   Chief Complaint Chief Complaint  Patient presents with  . Foot Pain    right    HPI Catherine Le is a 22 y.o. female with past medical history as outlined below presenting with 3-4-day history of tingling pain and swelling in her right lower extremity.  She denies injury but does report having to stand for long periods of time with her job in a [employer].  This is a job she has had for some time so is not new activity for her. She does endorse "soreness" that radiates into her right calf.  She denies injury and has had no long periods of rest or inactivity.  She also denies sob, chest pain, fevers, chills, rash or changes in skin coloration. Her symptoms improve but do not completely resolve with elevation.   The history is provided by the patient.    Past Medical History:  Diagnosis Date  . Headache   . Irregular bleeding 06/16/2015  . Moody 06/16/2015  . Nexplanon in place 06/16/2015    Patient Active Problem List   Diagnosis Date Noted  . Nexplanon in place 06/16/2015  . Moody 06/16/2015  . Irregular bleeding 06/16/2015  . Nexplanon insertion 04/30/2015  . Supervision of normal pregnancy 02/25/2015  . Late prenatal care affecting pregnancy 11/18/2013    Past Surgical History:  Procedure Laterality Date  . TONSILLECTOMY      OB History    Gravida Para Term Preterm AB Living   3 2 2     2    SAB TAB Ectopic Multiple Live Births         0 2       Home Medications    Prior to Admission medications   Medication Sig Start Date End Date Taking? Authorizing Provider  cephALEXin (KEFLEX) 500 MG capsule Take 1 capsule (500 mg total) by mouth 4 (four) times daily. Patient not taking: Reported on 03/09/2017 05/26/16   Lazaro Arms, MD  cyclobenzaprine (FLEXERIL) 5 MG tablet Take 2 tablets (10 mg total) by mouth 3 (three) times daily as  needed. Patient not taking: Reported on 10/20/2015 08/27/15   Pauline Aus, PA-C  etonogestrel (NEXPLANON) 68 MG IMPL implant 1 each by Subdermal route once.    [provider]  ibuprofen (ADVIL,MOTRIN) 600 MG tablet Take 1 tablet (600 mg total) by mouth every 8 (eight) hours as needed for moderate pain. 03/23/17   Burgess Amor, PA-C  loperamide (IMODIUM) 2 MG capsule Take 1 capsule (2 mg total) by mouth 4 (four) times daily as needed for diarrhea or loose stools. Patient not taking: Reported on 10/20/2015 08/02/15   Ward, Layla Maw, DO  megestrol (MEGACE) 40 MG tablet Take 1 tablet (40 mg total) by mouth daily. May increase to 2-3/day prn bleeding 02/09/17   Cresenzo-Dishmon, Scarlette Calico, CNM  ondansetron (ZOFRAN ODT) 4 MG disintegrating tablet Take 1 tablet (4 mg total) by mouth every 8 (eight) hours as needed for nausea or vomiting. Patient not taking: Reported on 10/20/2015 08/02/15   Ward, Layla Maw, DO  promethazine (PHENERGAN) 25 MG tablet Take 1 tablet (25 mg total) by mouth every 6 (six) hours as needed for nausea or vomiting. Patient not taking: Reported on 10/20/2015 08/02/15   Ward, Layla Maw, DO  traMADol (ULTRAM) 50 MG tablet Take 1 tablet (50 mg total) by mouth every 6 (six)  hours as needed. Patient not taking: Reported on 10/20/2015 08/29/15   Pauline Ausriplett, Tammy, PA-C    Family History Family History  Problem Relation Age of Onset  . Hypertension Father   . Asthma Father   . Hypertension Maternal Grandmother   . Cancer Maternal Grandmother        lung  . Hypertension Maternal Grandfather   . Cancer Maternal Aunt        breast    Social History Social History   Tobacco Use  . Smoking status: Never Smoker  . Smokeless tobacco: Never Used  Substance Use Topics  . Alcohol use: No  . Drug use: No     Allergies   Shrimp [shellfish allergy]   Review of Systems Review of Systems  Constitutional: Negative for chills and fever.  HENT: Negative for congestion and sore  throat.   Eyes: Negative.   Respiratory: Negative for chest tightness and shortness of breath.   Cardiovascular: Positive for leg swelling. Negative for chest pain and palpitations.  Gastrointestinal: Negative for abdominal pain and nausea.  Genitourinary: Negative.   Musculoskeletal: Negative for arthralgias, joint swelling and neck pain.  Skin: Negative.  Negative for color change, rash and wound.  Neurological: Negative for dizziness, weakness, light-headedness, numbness and headaches.  Psychiatric/Behavioral: Negative.      Physical Exam Updated Vital Signs BP 121/84 (BP Location: Right Arm)   Pulse 97   Temp 99 F (37.2 C) (Oral)   Resp 18   Ht 5\' 1"  (1.549 m)   Wt 100.7 kg (222 lb)   SpO2 100%   BMI 41.95 kg/m   Physical Exam  Constitutional: She appears well-developed and well-nourished.  HENT:  Head: Atraumatic.  Neck: Normal range of motion.  Cardiovascular: Normal rate, regular rhythm, normal heart sounds and intact distal pulses.  Pulses equal bilaterally  Musculoskeletal: She exhibits edema and tenderness.  Nonpitting somewhat tender edema of right foot and ankle.  No erythema, rash or other skin concerns.  Soreness of the right calf with palpation. No cords, induration, red streaking.  Circumference of right ankle compared to left 1 inch larger, right calf 2.5 inches below inferior patellar edge equal in circumference to left.   Neurological: She is alert. She has normal strength. She displays normal reflexes. No sensory deficit.  Skin: Skin is warm and dry.  Psychiatric: She has a normal mood and affect.     ED Treatments / Results  Labs (all labs ordered are listed, but only abnormal results are displayed) Labs Reviewed  CBC WITH DIFFERENTIAL/PLATELET - Abnormal; Notable for the following components:      Result Value   WBC 11.3 (*)    Hemoglobin 11.4 (*)    All other components within normal limits  BASIC METABOLIC PANEL    EKG  EKG  Interpretation None       Radiology Dg Foot Complete Right  Result Date: 03/23/2017 CLINICAL DATA:  Pain and swelling EXAM: RIGHT FOOT COMPLETE - 3+ VIEW COMPARISON:  None. FINDINGS: Frontal, oblique, and lateral views obtained. There is soft tissue swelling. No fracture or dislocation. Joint spaces appear normal. No erosive change. IMPRESSION: Soft tissue swelling. No fracture or dislocation. No evident arthropathy. Electronically Signed   By: Bretta BangWilliam  Woodruff III M.D.   On: 03/23/2017 21:10    Procedures Procedures (including critical care time)  Medications Ordered in ED Medications  ibuprofen (ADVIL,MOTRIN) tablet 800 mg (800 mg Oral Given 03/23/17 2055)     Initial Impression / Assessment and  Plan / ED Course  I have reviewed the triage vital signs and the nursing notes.  Pertinent labs & imaging results that were available during my care of the patient were reviewed by me and considered in my medical decision making (see chart for details).     Labs reviewed and discussed with pt.  No renal insufficiency as cause of edema.  Unilateral sx concerning for possible dvt. Venous US ordered for am. Pt aware to call for appt time.  Further management pending results of this test.  She was prescribed ibuprofen. Discussed continued heat and elevation.   Final Clinical Impressions(s) / ED Diagnoses   Final diagnoses:  Right foot pain  Edema of right foot    ED Discharge Orders        Ordered    US Venous Img Lower Unilateral Right     03/23/17 2208    ibuprofen (ADVIL,MOTRIN) 600 MG tablet  Every 8 hours PRN     03/23/17 2213       Burgess Amor, PA-C 03/24/17 0110    Samuel Jester, DO 03/24/17 2352

## 2017-04-13 ENCOUNTER — Other Ambulatory Visit: Payer: 59 | Admitting: Adult Health

## 2017-05-11 ENCOUNTER — Other Ambulatory Visit: Payer: 59 | Admitting: Adult Health

## 2017-09-09 ENCOUNTER — Other Ambulatory Visit: Payer: Self-pay | Admitting: Advanced Practice Midwife

## 2017-12-19 ENCOUNTER — Telehealth: Payer: Self-pay | Admitting: Adult Health

## 2017-12-19 MED ORDER — MEGESTROL ACETATE 40 MG PO TABS: ORAL_TABLET | ORAL | 1 refills | Status: DC

## 2017-12-19 NOTE — Telephone Encounter (Signed)
Bleeding with nexplanon, will refill megace, but need to make appt for pap and physical and she said she would call back and do that

## 2017-12-19 NOTE — Telephone Encounter (Signed)
Patient called stating that she tried calling her pharmacy to get refills of her Mages but they told her we need to send a script. Please contact pt

## 2018-01-30 ENCOUNTER — Telehealth: Payer: Self-pay | Admitting: Advanced Practice Midwife

## 2018-01-30 NOTE — Telephone Encounter (Signed)
Pt called stating that she would like to have her nexplanon removed. She states since it was put in in 2017 she has had to use megace to help with the bleeding. She states that the megace did not help at all this last time and she is tired of dealing with it. Advised that if that is something that she wants we would be happy to give her an appt. Pt then connected to scheduling.

## 2018-01-30 NOTE — Telephone Encounter (Signed)
Patient called stating that she would like to speak with fran regarding her birth control. I let patient know that Drenda Freeze is not in the office today and pt would like to know if another provider could help her. Please contacts pt

## 2018-02-05 ENCOUNTER — Encounter: Payer: 59 | Admitting: Adult Health

## 2018-02-20 ENCOUNTER — Encounter (HOSPITAL_COMMUNITY): Payer: Self-pay | Admitting: Emergency Medicine

## 2018-02-20 ENCOUNTER — Emergency Department (HOSPITAL_COMMUNITY)
Admission: EM | Admit: 2018-02-20 | Discharge: 2018-02-20 | Disposition: A | Discharge status: Home / Self Care | Payer: 59 | Attending: Emergency Medicine | Admitting: Emergency Medicine

## 2018-02-20 ENCOUNTER — Other Ambulatory Visit: Payer: Self-pay

## 2018-02-20 ENCOUNTER — Emergency Department (HOSPITAL_COMMUNITY): Payer: 59

## 2018-02-20 DIAGNOSIS — Z79899 Other long term (current) drug therapy: Secondary | ICD-10-CM | POA: Diagnosis not present

## 2018-02-20 DIAGNOSIS — M545 Low back pain, unspecified: Secondary | ICD-10-CM

## 2018-02-20 DIAGNOSIS — R0789 Other chest pain: Secondary | ICD-10-CM | POA: Diagnosis not present

## 2018-02-20 DIAGNOSIS — R079 Chest pain, unspecified: Secondary | ICD-10-CM | POA: Diagnosis present

## 2018-02-20 LAB — CBC
HEMATOCRIT: 40.5 % (ref 36.0–46.0)
Hemoglobin: 12.9 g/dL (ref 12.0–15.0)
MCH: 28.3 pg (ref 26.0–34.0)
MCHC: 31.9 g/dL (ref 30.0–36.0)
MCV: 88.8 fL (ref 80.0–100.0)
Platelets: 400 10*3/uL (ref 150–400)
RBC: 4.56 MIL/uL (ref 3.87–5.11)
RDW: 13.9 % (ref 11.5–15.5)
WBC: 13.1 10*3/uL — ABNORMAL HIGH (ref 4.0–10.5)
nRBC: 0 % (ref 0.0–0.2)

## 2018-02-20 LAB — BASIC METABOLIC PANEL
Anion gap: 12 (ref 5–15)
BUN: 11 mg/dL (ref 6–20)
CO2: 22 mmol/L (ref 22–32)
CREATININE: 0.7 mg/dL (ref 0.44–1.00)
Calcium: 9.2 mg/dL (ref 8.9–10.3)
Chloride: 108 mmol/L (ref 98–111)
GFR calc Af Amer: >60 mL/min (ref >60)
GFR calc non Af Amer: >60 mL/min (ref >60)
Glucose, Bld: 104 mg/dL — ABNORMAL HIGH (ref 70–99)
Potassium: 3.4 mmol/L — ABNORMAL LOW (ref 3.5–5.1)
SODIUM: 142 mmol/L (ref 135–145)

## 2018-02-20 LAB — I-STAT BETA HCG BLOOD, ED (MC, WL, AP ONLY): I-stat hCG, quantitative: <5 m[IU]/mL (ref <5)

## 2018-02-20 LAB — I-STAT TROPONIN, ED: Troponin i, poc: 0 ng/mL (ref 0.00–0.08)

## 2018-02-20 MED ORDER — SODIUM CHLORIDE 0.9% FLUSH
3.0000 mL | Freq: Once | INTRAVENOUS | Status: AC
  Administered 2018-02-20: 3 mL via INTRAVENOUS

## 2018-02-20 MED ORDER — NAPROXEN 500 MG PO TABS: 500.0000 mg | ORAL_TABLET | Freq: Two times a day (BID) | ORAL | 0 refills | Status: DC

## 2018-02-20 MED ORDER — NAPROXEN 250 MG PO TABS
500.0000 mg | ORAL_TABLET | Freq: Once | ORAL | Status: AC
Start: 2018-02-20 — End: 2018-02-20
  Administered 2018-02-20: 500 mg via ORAL
  Filled 2018-02-20: qty 2

## 2018-02-20 NOTE — Discharge Instructions (Signed)
Steps to find a Primary Care Provider (PCP): ° °Call [phone] or [phone] to access "[place] Find a Doctor Service." ° °2.  You may also go on the [facility] website at [email or web address] ° °3.  [facility] also frequently accepts new patients. ° °[facility]  °[address] °[place] [address] °[phone] ° °4.  There are also multiple [facility], [facility] and [facility] practices throughout the [place] that are frequently accepting new patients. You may find a clinic that is close to your home and contact them. ° °[facility] °[email or web address] °[phone] ° °[facility] °[email or web address] ° °[facility] °[email or web address] °[phone] ° °[facility] °[email or web address] °[phone] ° °5.  Local Health Departments also can provide primary care services. ° °[facility]  °[address] °[place] [address] °[phone] ° °[facility] °[address] °[place] [address] °[phone] ° °[facility] °[address]  °[place] [address] °[phone] ° ° °

## 2018-02-20 NOTE — ED Triage Notes (Signed)
Pt c/o chest pressure and shortness of breath that started tonight. One episode of vomiting earlier in the day.

## 2018-02-20 NOTE — ED Provider Notes (Signed)
TIME SEEN: 6:39 AM  CHIEF COMPLAINT: Chest pain, back pain  HPI: Patient is a 23 year old Le with history of obesity who presents to the emergency department with chest pain, lower back pain.  States chest pain started early this morning and described as a heaviness on the left side of her chest without radiation.  Worse with palpation of the chest, movement and deep inspiration.  No fevers or cough.  No shortness of breath, vomiting, dizziness or diaphoresis.  Does report a grandmother who had an MI at 19 years old.  No history of PE, DVT, exogenous estrogen use, recent fractures, surgery, trauma, hospitalization or prolonged travel. No lower extremity swelling or pain. No calf tenderness.  She does have a Nexplanon.  No tobacco use.  Also complaining of lower back pain.  No injury to her back that she can recall.  No numbness, tingling or focal weakness.  No bowel or bladder incontinence.  Pain worse with movement and palpation.  ROS: See HPI Constitutional: no fever  Eyes: no drainage  ENT: no runny nose   Cardiovascular:   chest pain  Resp: no SOB  GI: no vomiting GU: no dysuria Integumentary: no rash  Allergy: no hives  Musculoskeletal: no leg swelling  Neurological: no slurred speech ROS otherwise negative  PAST MEDICAL HISTORY/PAST SURGICAL HISTORY:  Past Medical History:  Diagnosis Date  . Headache   . Irregular bleeding 06/16/2015  . Moody 06/16/2015  . Nexplanon in place 06/16/2015    MEDICATIONS:  Prior to Admission medications   Medication Sig Start Date End Date Taking? Authorizing Provider  cephALEXin (KEFLEX) 500 MG capsule Take 1 capsule (500 mg total) by mouth 4 (four) times daily. Patient not taking: Reported on 03/09/2017 05/26/16   Lazaro Arms, MD  cyclobenzaprine (FLEXERIL) 5 MG tablet Take 2 tablets (10 mg total) by mouth 3 (three) times daily as needed. Patient not taking: Reported on 10/20/2015 08/27/15   Pauline Aus, PA-C  etonogestrel (NEXPLANON) 68  MG IMPL implant 1 each by Subdermal route once.    [provider]  ibuprofen (ADVIL,MOTRIN) 600 MG tablet Take 1 tablet (600 mg total) by mouth every 8 (eight) hours as needed for moderate pain. 03/23/17   Burgess Amor, PA-C  loperamide (IMODIUM) 2 MG capsule Take 1 capsule (2 mg total) by mouth 4 (four) times daily as needed for diarrhea or loose stools. Patient not taking: Reported on 10/20/2015 08/02/15   Ward, Layla Maw, DO  megestrol (MEGACE) 40 MG tablet TAKE 1 TABLET BY MOUTH ONCE DAILY MAY  INCREASE  TO  2-3  PER  DAY  AS  NEEDED  FOR  BLEEDING 12/19/17   Cyril Mourning A, NP  ondansetron (ZOFRAN ODT) 4 MG disintegrating tablet Take 1 tablet (4 mg total) by mouth every 8 (eight) hours as needed for nausea or vomiting. Patient not taking: Reported on 10/20/2015 08/02/15   Ward, Layla Maw, DO  promethazine (PHENERGAN) 25 MG tablet Take 1 tablet (25 mg total) by mouth every 6 (six) hours as needed for nausea or vomiting. Patient not taking: Reported on 10/20/2015 08/02/15   Ward, Layla Maw, DO  traMADol (ULTRAM) 50 MG tablet Take 1 tablet (50 mg total) by mouth every 6 (six) hours as needed. Patient not taking: Reported on 10/20/2015 08/29/15   Pauline Aus, PA-C    ALLERGIES:  Allergies  Allergen Reactions  . Shrimp [Shellfish Allergy] Shortness Of Breath    SOCIAL HISTORY:  Social History   Tobacco Use  .  Smoking status: Never Smoker  . Smokeless tobacco: Never Used  Substance Use Topics  . Alcohol use: No    FAMILY HISTORY: Family History  Problem Relation Age of Onset  . Hypertension Father   . Asthma Father   . Hypertension Maternal Grandmother   . Cancer Maternal Grandmother        lung  . Hypertension Maternal Grandfather   . Cancer Maternal Aunt        breast    EXAM: BP 121/79 (BP Location: Right Arm)   Pulse 82   Temp 98.3 F (36.8 C) (Oral)   Resp 16   LMP 01/09/2018 (Approximate) Comment: pt had impant removed in dec and switched to birth  control pills  SpO2 99%  CONSTITUTIONAL: Alert and oriented and responds appropriately to questions. Well-appearing; well-nourished HEAD: Normocephalic EYES: Conjunctivae clear, pupils appear equal, EOMI ENT: normal nose; moist mucous membranes NECK: Supple, no meningismus, no nuchal rigidity, no LAD  CARD: RRR; S1 and S2 appreciated; no murmurs, no clicks, no rubs, no gallops CHEST:  Chest wall is tender to palpation over the anterior left chest wall which reproduces her pain.  No crepitus, ecchymosis, erythema, warmth, rash or other lesions present.   RESP: Normal chest excursion without splinting or tachypnea; breath sounds clear and equal bilaterally; no wheezes, no rhonchi, no rales, no hypoxia or respiratory distress, speaking full sentences ABD/GI: Normal bowel sounds; non-distended; soft, non-tender, no rebound, no guarding, no peritoneal signs, no hepatosplenomegaly BACK:  The back appears normal and is tender diffusely over the lumbar paraspinal muscles bilaterally but no midline step-off or deformity, no redness or warmth, no swelling or ecchymosis EXT: Normal ROM in all joints; non-tender to palpation; no edema; normal capillary refill; no cyanosis, no calf tenderness or swelling    SKIN: Normal color for age and race; warm; no rash NEURO: Moves all extremities equally, normal sensation diffusely, no saddle anesthesia, normal gait PSYCH: The patient's mood and manner are appropriate. Grooming and personal hygiene are appropriate.  MEDICAL DECISION MAKING: Patient here with atypical chest pain.  Suspect chest wall pain.  Pain reproducible with palpation and worse with movement.  Recommended NSAIDs.  EKG shows no ischemic abnormality.  She is PERC negative.  Doubt ACS.  Doubt dissection.  No sign of pneumonia or volume overload.   Also complaining of lower back pain.  Seems to be musculoskeletal in nature.  Worse with movement.  Doubt kidney stone, kidney infection.  Doubt cauda equina,  epidural abscess or hematoma, discitis or osteomyelitis, transverse myelitis, fracture.  Neurologically intact today.  I feel NSAIDs will also help with this pain as well.  I feel she is safe to be discharged home.   At this time, I do not feel there is any life-threatening condition present. I have reviewed and discussed all results (EKG, imaging, lab, urine as appropriate) and exam findings with patient/family. I have reviewed nursing notes and appropriate previous records.  I feel the patient is safe to be discharged home without further emergent workup and can continue workup as an outpatient as needed. Discussed usual and customary return precautions. Patient/family verbalize understanding and are comfortable with this plan.  Outpatient follow-up has been provided as needed. All questions have been answered.     EKG Interpretation  Date/Time:  Tuesday February 20 2018 06:48:38 EST Ventricular Rate:  77 PR Interval:    QRS Duration: 87 QT Interval:  384 QTC Calculation: 435 R Axis:   50 Text Interpretation:  Sinus  arrhythmia No significant change since last tracing Confirmed by Ward, Baxter HireKristen [phone] on 02/20/2018 6:57:16 AM         Ward, Layla MawKristen N, DO 02/20/18 0715

## 2018-04-11 ENCOUNTER — Telehealth: Payer: Self-pay | Admitting: *Deleted

## 2018-04-11 ENCOUNTER — Ambulatory Visit (INDEPENDENT_AMBULATORY_CARE_PROVIDER_SITE_OTHER): Payer: Self-pay | Admitting: Obstetrics and Gynecology

## 2018-04-11 ENCOUNTER — Other Ambulatory Visit: Payer: Self-pay

## 2018-04-11 ENCOUNTER — Telehealth: Payer: Self-pay | Admitting: Obstetrics and Gynecology

## 2018-04-11 ENCOUNTER — Encounter: Payer: Self-pay | Admitting: Obstetrics and Gynecology

## 2018-04-11 DIAGNOSIS — N921 Excessive and frequent menstruation with irregular cycle: Secondary | ICD-10-CM

## 2018-04-11 DIAGNOSIS — Z975 Presence of (intrauterine) contraceptive device: Secondary | ICD-10-CM

## 2018-04-11 MED ORDER — TRANEXAMIC ACID 650 MG PO TABS: 1300.0000 mg | ORAL_TABLET | Freq: Three times a day (TID) | ORAL | 2 refills | Status: DC

## 2018-04-11 NOTE — Telephone Encounter (Signed)
Attempted to call patient back, no answer.   

## 2018-04-11 NOTE — Telephone Encounter (Signed)
Telemedicine appt documented elsewhere in formal tele-note.

## 2018-04-11 NOTE — Progress Notes (Signed)
   TELEHEALTH VIRTUAL GYNECOLOGY VISIT ENCOUNTER NOTE, Via WebEx  I connected with Catherine Le on 04/11/18 at  9:00 AM EDT by telephone at home and verified that I am speaking with the correct person using two identifiers.   I discussed the limitations, risks, security and privacy concerns of performing an evaluation and management service by telephone and the availability of in person appointments. I also discussed with the patient that there may be a patient responsible charge related to this service. The patient expressed understanding and agreed to proceed.   History:  Catherine Le is a 23 y.o. G7P2002 female being evaluated today for breakthrough bleeding on Nexplanon.  She took 1 day of Megace but stopped it because the bleeding was not improved.  She is been bleeding for about 3 days which has some large clots there is been no lightheadedness.  There is no pelvic pain or other concerns.       Past Medical History:  Diagnosis Date  . Headache   . Irregular bleeding 06/16/2015  . Moody 06/16/2015  . Nexplanon in place 06/16/2015   Past Surgical History:  Procedure Laterality Date  . TONSILLECTOMY     The following portions of the patient's history were reviewed and updated as appropriate: allergies, current medications, past family history, past medical history, past social history, past surgical history and problem list.   Health Maintenance:  Normal pap and negative HRHPV on not noted in epic.  Normal mammogram  not applicable.   Review of Systems:  Pertinent items noted in HPI and remainder of comprehensive ROS otherwise negative.  Physical Exam:  Physical exam deferred due to nature of the encounter  Labs and Imaging No results found for this or any previous visit (from the past 336 hour(s)). No results found.    Assessment and Plan:     Breakthrough bleeding on Nexplanon, heavy   Plan: We will begin tranexamic acid 1300 mg 3 times daily with prescription called  into pharmacy electronically Patient to notify us in 24 hours if the bleeding is improved or not.  Will consider adding estradiol 1 mg 3 times daily if bleeding continues   I discussed the assessment and treatment plan with the patient. The patient was provided an opportunity to ask questions and all were answered. The patient agreed with the plan and demonstrated an understanding of the instructions.   The patient was advised to call back or seek an in-person evaluation/go to the ED if the symptoms worsen or if the condition fails to improve as anticipated.  I provided 10 minutes of face-to-face time during this encounter.   Tilda Burrow, MD [facility]

## 2018-04-11 NOTE — Telephone Encounter (Signed)
Patient returned call. She has been bleeding heavy since Saturday, changing tampon every 1-1.5 hours. Spoke with Dr. Emelda Fear about patient, he will do a televisit with her today. Advised her that we will call back to initiate visit.

## 2018-04-11 NOTE — Telephone Encounter (Signed)
Patient called wanting to schedule an appointment for uncontrollable bleeding, patient states it has been going on since Thursday but got really bad Saturday morning. Please advise [phone]

## 2018-10-04 ENCOUNTER — Other Ambulatory Visit: Payer: Self-pay

## 2018-10-04 ENCOUNTER — Emergency Department (HOSPITAL_COMMUNITY)
Admission: EM | Admit: 2018-10-04 | Discharge: 2018-10-04 | Disposition: A | Discharge status: Home / Self Care | Payer: Self-pay | Attending: Emergency Medicine | Admitting: Emergency Medicine

## 2018-10-04 ENCOUNTER — Emergency Department (HOSPITAL_COMMUNITY): Payer: Self-pay

## 2018-10-04 ENCOUNTER — Encounter (HOSPITAL_COMMUNITY): Payer: Self-pay

## 2018-10-04 DIAGNOSIS — S060X0A Concussion without loss of consciousness, initial encounter: Secondary | ICD-10-CM | POA: Insufficient documentation

## 2018-10-04 DIAGNOSIS — Y9389 Activity, other specified: Secondary | ICD-10-CM | POA: Insufficient documentation

## 2018-10-04 DIAGNOSIS — Y999 Unspecified external cause status: Secondary | ICD-10-CM | POA: Insufficient documentation

## 2018-10-04 DIAGNOSIS — Z79899 Other long term (current) drug therapy: Secondary | ICD-10-CM | POA: Insufficient documentation

## 2018-10-04 DIAGNOSIS — S0990XA Unspecified injury of head, initial encounter: Secondary | ICD-10-CM | POA: Insufficient documentation

## 2018-10-04 DIAGNOSIS — Y929 Unspecified place or not applicable: Secondary | ICD-10-CM | POA: Insufficient documentation

## 2018-10-04 LAB — I-STAT BETA HCG BLOOD, ED (MC, WL, AP ONLY): I-stat hCG, quantitative: <5 m[IU]/mL (ref <5)

## 2018-10-04 MED ORDER — NAPROXEN 500 MG PO TABS
500.0000 mg | ORAL_TABLET | Freq: Once | ORAL | Status: AC
  Administered 2018-10-04: 500 mg via ORAL
  Filled 2018-10-04: qty 1

## 2018-10-04 MED ORDER — ONDANSETRON 4 MG PO TBDP: 4.0000 mg | ORAL_TABLET | Freq: Three times a day (TID) | ORAL | 0 refills | Status: DC | PRN
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
## 2018-10-04 NOTE — Discharge Instructions (Addendum)
As discussed, your evaluation today has been largely reassuring.  But, it is important that you monitor your condition carefully, and do not hesitate to return to the ED if you develop new, or concerning changes in your condition.  You have been prescribed nausea medication to assist with your symptoms. In addition to this, please use ibuprofen, 400 mg, 3 times daily for additional pain control.  Please follow-up with your physician for appropriate ongoing care.

## 2019-01-10 ENCOUNTER — Other Ambulatory Visit: Payer: Self-pay

## 2019-01-10 DIAGNOSIS — Z20822 Contact with and (suspected) exposure to covid-19: Secondary | ICD-10-CM

## 2019-01-12 LAB — NOVEL CORONAVIRUS, NAA: SARS-CoV-2, NAA: NOT DETECTED

## 2019-05-28 ENCOUNTER — Inpatient Hospital Stay (HOSPITAL_COMMUNITY)
Admission: EM | Admit: 2019-05-28 | Discharge: 2019-05-28 | Disposition: A | Discharge status: Home / Self Care | Payer: Medicaid Other | Attending: Obstetrics and Gynecology | Admitting: Obstetrics and Gynecology

## 2019-05-28 ENCOUNTER — Other Ambulatory Visit: Payer: Self-pay

## 2019-05-28 ENCOUNTER — Encounter (HOSPITAL_COMMUNITY): Payer: Self-pay | Admitting: Obstetrics and Gynecology

## 2019-05-28 DIAGNOSIS — R42 Dizziness and giddiness: Secondary | ICD-10-CM | POA: Diagnosis not present

## 2019-05-28 DIAGNOSIS — Z3A00 Weeks of gestation of pregnancy not specified: Secondary | ICD-10-CM | POA: Diagnosis not present

## 2019-05-28 DIAGNOSIS — O26891 Other specified pregnancy related conditions, first trimester: Secondary | ICD-10-CM | POA: Diagnosis not present

## 2019-05-28 DIAGNOSIS — O219 Vomiting of pregnancy, unspecified: Secondary | ICD-10-CM | POA: Diagnosis not present

## 2019-05-28 DIAGNOSIS — R8290 Unspecified abnormal findings in urine: Secondary | ICD-10-CM

## 2019-05-28 LAB — COMPREHENSIVE METABOLIC PANEL
ALT: 20 U/L (ref 0–44)
AST: 21 U/L (ref 15–41)
Albumin: 3.9 g/dL (ref 3.5–5.0)
Alkaline Phosphatase: 53 U/L (ref 38–126)
Anion gap: 12 (ref 5–15)
BUN: 7 mg/dL (ref 6–20)
CO2: 21 mmol/L — ABNORMAL LOW (ref 22–32)
Calcium: 9.3 mg/dL (ref 8.9–10.3)
Chloride: 103 mmol/L (ref 98–111)
Creatinine, Ser: 0.61 mg/dL (ref 0.44–1.00)
GFR calc Af Amer: >60 mL/min (ref >60)
GFR calc non Af Amer: >60 mL/min (ref >60)
Glucose, Bld: 95 mg/dL (ref 70–99)
Potassium: 4.1 mmol/L (ref 3.5–5.1)
Sodium: 136 mmol/L (ref 135–145)
Total Bilirubin: 0.6 mg/dL (ref 0.3–1.2)
Total Protein: 7.7 g/dL (ref 6.5–8.1)

## 2019-05-28 LAB — URINALYSIS, ROUTINE W REFLEX MICROSCOPIC
Bilirubin Urine: NEGATIVE
Glucose, UA: NEGATIVE mg/dL
Hgb urine dipstick: NEGATIVE
Ketones, ur: NEGATIVE mg/dL
Nitrite: NEGATIVE
Protein, ur: NEGATIVE mg/dL
Specific Gravity, Urine: 1.02 (ref 1.005–1.030)
pH: 6 (ref 5.0–8.0)

## 2019-05-28 LAB — POCT PREGNANCY, URINE: Preg Test, Ur: POSITIVE — AB

## 2019-05-28 LAB — CBC
HCT: 38.9 % (ref 36.0–46.0)
Hemoglobin: 12.6 g/dL (ref 12.0–15.0)
MCH: 28.8 pg (ref 26.0–34.0)
MCHC: 32.4 g/dL (ref 30.0–36.0)
MCV: 88.8 fL (ref 80.0–100.0)
Platelets: 389 10*3/uL (ref 150–400)
RBC: 4.38 MIL/uL (ref 3.87–5.11)
RDW: 15.2 % (ref 11.5–15.5)
WBC: 12.6 10*3/uL — ABNORMAL HIGH (ref 4.0–10.5)
nRBC: 0 % (ref 0.0–0.2)

## 2019-05-28 LAB — HCG, QUANTITATIVE, PREGNANCY: hCG, Beta Chain, Quant, S: 33124 m[IU]/mL — ABNORMAL HIGH (ref <5)

## 2019-05-28 MED ORDER — PYRIDOXINE HCL 25 MG PO TABS: 25.0000 mg | ORAL_TABLET | Freq: Three times a day (TID) | ORAL | 0 refills | Status: DC

## 2019-05-28 MED ORDER — PROMETHAZINE HCL 25 MG PO TABS: 25.0000 mg | ORAL_TABLET | Freq: Four times a day (QID) | ORAL | 0 refills | Status: DC | PRN

## 2019-05-28 MED ORDER — DOXYLAMINE SUCCINATE (SLEEP) 25 MG PO TABS: 25.0000 mg | ORAL_TABLET | Freq: Four times a day (QID) | ORAL | 2 refills | Status: DC | PRN

## 2019-05-28 NOTE — ED Triage Notes (Signed)
Pt reports N/V all weekend and had positive pregnancy test at [facility] yesterday.

## 2019-05-28 NOTE — Discharge Instructions (Signed)
First Trimester of Pregnancy  The first trimester of pregnancy is from week 1 until the end of week 13 (months 1 through 3). During this time, your baby will begin to develop inside you. At 6-8 weeks, the eyes and face are formed, and the heartbeat can be seen on ultrasound. At the end of 12 weeks, all the baby's organs are formed. Prenatal care is all the medical care you receive before the birth of your baby. Make sure you get good prenatal care and follow all of your doctor's instructions. Follow these instructions at home: Medicines  Take over-the-counter and prescription medicines only as told by your doctor. Some medicines are safe and some medicines are not safe during pregnancy.  Take a prenatal vitamin that contains at least 600 micrograms (mcg) of folic acid.  If you have trouble pooping (constipation), take medicine that will make your stool soft (stool softener) if your doctor approves. Eating and drinking   Eat regular, healthy meals.  Your doctor will tell you the amount of weight gain that is right for you.  Avoid raw meat and uncooked cheese.  If you feel sick to your stomach (nauseous) or throw up (vomit): ? Eat 4 or 5 small meals a day instead of 3 large meals. ? Try eating a few soda crackers. ? Drink liquids between meals instead of during meals.  To prevent constipation: ? Eat foods that are high in fiber, like fresh fruits and vegetables, whole grains, and beans. ? Drink enough fluids to keep your pee (urine) clear or pale yellow. Activity  Exercise only as told by your doctor. Stop exercising if you have cramps or pain in your lower belly (abdomen) or low back.  Do not exercise if it is too hot, too humid, or if you are in a place of great height (high altitude).  Try to avoid standing for long periods of time. Move your legs often if you must stand in one place for a long time.  Avoid heavy lifting.  Wear low-heeled shoes. Sit and stand up  straight.  You can have sex unless your doctor tells you not to. Relieving pain and discomfort  Wear a good support bra if your breasts are sore.  Take warm water baths (sitz baths) to soothe pain or discomfort caused by hemorrhoids. Use hemorrhoid cream if your doctor says it is okay.  Rest with your legs raised if you have leg cramps or low back pain.  If you have puffy, bulging veins (varicose veins) in your legs: ? Wear support hose or compression stockings as told by your doctor. ? Raise (elevate) your feet for 15 minutes, 3-4 times a day. ? Limit salt in your food. Prenatal care  Schedule your prenatal visits by the twelfth week of pregnancy.  Write down your questions. Take them to your prenatal visits.  Keep all your prenatal visits as told by your doctor. This is important. Safety  Wear your seat belt at all times when driving.  Make a list of emergency phone numbers. The list should include numbers for family, friends, the hospital, and police and fire departments. General instructions  Ask your doctor for a referral to a local prenatal class. Begin classes no later than at the start of month 6 of your pregnancy.  Ask for help if you need counseling or if you need help with nutrition. Your doctor can give you advice or tell you where to go for help.  Do not use hot tubs, steam   rooms, or saunas.  Do not douche or use tampons or scented sanitary pads.  Do not cross your legs for long periods of time.  Avoid all herbs and alcohol. Avoid drugs that are not approved by your doctor.  Do not use any tobacco products, including cigarettes, chewing tobacco, and electronic cigarettes. If you need help quitting, ask your doctor. You may get counseling or other support to help you quit.  Avoid cat litter boxes and soil used by cats. These carry germs that can cause birth defects in the baby and can cause a loss of your baby (miscarriage) or stillbirth.  Visit your dentist.  At home, brush your teeth with a soft toothbrush. Be gentle when you floss. Contact a doctor if:  You are dizzy.  You have mild cramps or pressure in your lower belly.  You have a nagging pain in your belly area.  You continue to feel sick to your stomach, you throw up, or you have watery poop (diarrhea).  You have a bad smelling fluid coming from your vagina.  You have pain when you pee (urinate).  You have increased puffiness (swelling) in your face, hands, legs, or ankles. Get help right away if:  You have a fever.  You are leaking fluid from your vagina.  You have spotting or bleeding from your vagina.  You have very bad belly cramping or pain.  You gain or lose weight rapidly.  You throw up blood. It may look like coffee grounds.  You are around people who have [place] measles, fifth disease, or chickenpox.  You have a very bad headache.  You have shortness of breath.  You have any kind of trauma, such as from a fall or a car accident. Summary  The first trimester of pregnancy is from week 1 until the end of week 13 (months 1 through 3).  To take care of yourself and your unborn baby, you will need to eat healthy meals, take medicines only if your doctor tells you to do so, and do activities that are safe for you and your baby.  Keep all follow-up visits as told by your doctor. This is important as your doctor will have to ensure that your baby is healthy and growing well. This information is not intended to replace advice given to you by your health care provider. Make sure you discuss any questions you have with your health care provider. Document Revised: 04/19/2018 Document Reviewed: 01/05/2016 Elsevier Patient Education  2020 [employer].    Follow these instructions at home: Eating and drinking   Avoid the following: ? Drinking fluids with meals. Try not to drink anything during the 30 minutes before and after your meals. ? Drinking more than 1 cup  of fluid at a time. ? Eating foods that trigger your symptoms. These may include spicy foods, coffee, high-fat foods, very sweet foods, and acidic foods. ? Skipping meals. Nausea can be more intense on an empty stomach. If you cannot tolerate food, do not force it. Try sucking on ice chips or other frozen items and make up for missed calories later. ? Lying down within 2 hours after eating. ? Being exposed to environmental triggers. These may include food smells, smoky rooms, closed spaces, rooms with strong smells, warm or humid places, overly loud and noisy rooms, and rooms with motion or flickering lights. Try eating meals in a well-ventilated area that is free of strong smells. ? Quick and sudden changes in your movement. ? Taking  iron pills and multivitamins that contain iron. If you take prescription iron pills, do not stop taking them unless your health care provider approves. ? Preparing food. The smell of food can spoil your appetite or trigger nausea.  To help relieve your symptoms: ? Listen to your body. Everyone is different and has different preferences. Find what works best for you. ? Eat and drink slowly. ? Eat 5-6 small meals daily instead of 3 large meals. Eating small meals and snacks can help you avoid an empty stomach. ? In the morning, before getting out of bed, eat a couple of crackers to avoid moving around on an empty stomach. ? Try eating starchy foods as these are usually tolerated well. Examples include cereal, toast, bread, potatoes, pasta, rice, and pretzels. ? Include at least 1 serving of protein with your meals and snacks. Protein options include lean meats, poultry, seafood, beans, nuts, nut butters, eggs, cheese, and yogurt. ? Try eating a protein-rich snack before bed. Examples of a protein-rick snack include cheese and crackers or a peanut butter sandwich made with 1 slice of whole-wheat bread and 1 tsp (5 g) of peanut butter. ? Eat or suck on things that have  ginger in them. It may help relieve nausea. Add  tsp ground ginger to hot tea or choose ginger tea. ? Try drinking 100% fruit juice or an electrolyte drink. An electrolyte drink contains sodium, potassium, and chloride. ? Drink fluids that are cold, clear, and carbonated or sour. Examples include lemonade, ginger ale, lemon-lime soda, ice water, and sparkling water. ? Brush your teeth or use a mouth rinse after meals. ? Talk with your health care provider about starting a supplement of vitamin B6. General instructions  Take over-the-counter and prescription medicines only as told by your health care provider.  Follow instructions from your health care provider about eating or drinking restrictions.  Continue to take your prenatal vitamins as told by your health care provider. If you are having trouble taking your prenatal vitamins, talk with your health care provider about different options.  Keep all follow-up and pre-birth (prenatal) visits as told by your health care provider. This is important. Contact a health care provider if:  You have pain in your abdomen.  You have a severe headache.  You have vision problems.  You are losing weight.  You feel weak or dizzy. Get help right away if:  You cannot drink fluids without vomiting.  You vomit blood.  You have constant nausea and vomiting.  You are very weak.  You faint.  You have a fever and your symptoms suddenly get worse. Summary  Making some changes to your eating habits may help relieve nausea and vomiting.  This condition may be managed with medicine.  If medicines do not help relieve nausea and vomiting, you may need to receive fluids through an IV at the hospital. This information is not intended to replace advice given to you by your health care provider. Make sure you discuss any questions you have with your health care provider. Document Revised: 01/16/2017 Document Reviewed: 08/26/2015 Elsevier Patient  Education  2020 [place] for [place] @ [facility]   Phone: [phone]  [facility] [place] @ [place]   Phone: [place] [facility]  [facility]       Phone: [place]  for [employer] @ [place]     Phone: [phone]          [facility] for [employer] @ [employer]   Phone: [phone]  Center for [facility] @ [place]  Phone: [phone]  Center for [facility] @ [facility] Phone: [phone]     [facility]  Phone: [phone]  [facility]  Phone: [phone]  [facility] Phone: [phone]  [facility] Phone: [phone]  [facility] Phone: [phone]  [facility] Phone: [phone]  [facility]  Phone: [phone]

## 2019-05-28 NOTE — ED Provider Notes (Signed)
MSE was initiated and I personally evaluated the patient and placed orders (if any) at  12:20 PM on May 28, 2019.  Patient presenting with nausea and vomiting after positive pregnancy test at [facility] yesterday.  Reports some mild cramping to her sides though no severe abdominal pain.  She also reports she may have had some spotting over the weekend though no active bleeding.  Denies leakage of fluids.  She has not had any formal OB care.   Patient is in no distress at this time.  Patient is stable for transfer to the MAU.  Discussed with Herbert Seta, APP at the MAU, accepting patient.  The patient appears stable so that the remainder of the MSE may be completed by another provider.   Juliet Vasbinder, [place] N, PA-C 05/28/19 1221    Pricilla Loveless, MD 05/28/19 1306

## 2019-05-28 NOTE — MAU Note (Addendum)
Sent up from the ED.  Early preg,  Some spotting on Friday, none now.  c/o being very light headed.  Started throwing on Friday.  Balance has been off. +HPT x 17; went to [facility] yesterday, they confirmed.  Still feeling dizzy and off balance.

## 2019-05-28 NOTE — MAU Provider Note (Signed)
History     CSN: [id number]  Arrival date and time: 05/28/19 1105   First Provider Initiated Contact with Patient 05/28/19 1354      Chief Complaint  Patient presents with  . Emesis  . Nausea  . Dizziness   HPI Catherine Le is a 24 y.o. [phone] in early pregnancy who presents to MAU with chief complaints of nausea, vomiting and dizziness. These are new problems, onset last weekend. Patient endorses multiple attempts for PO intake including soup and Chipotle without success. She denies abdominal pain, vaginal bleeding, abnormal vaginal discharge, fever or recent illness.  She has not selected a prenatal care provider and requests a list.  OB History    Gravida  4   Para  2   Term  2   Preterm      AB  1   Living  2     SAB  1   TAB      Ectopic      Multiple  0   Live Births  2           Past Medical History:  Diagnosis Date  . Headache   . Irregular bleeding 06/16/2015  . [place] 06/16/2015  . Nexplanon in place 06/16/2015    Past Surgical History:  Procedure Laterality Date  . TONSILLECTOMY      Family History  Problem Relation Age of Onset  . Hypertension Father   . Asthma Father   . Hypertension Maternal Grandmother   . Cancer Maternal Grandmother        lung  . Hypertension Maternal Grandfather   . Cancer Maternal Aunt        breast    Social History   Tobacco Use  . Smoking status: Never Smoker  . Smokeless tobacco: Never Used  Substance Use Topics  . Alcohol use: No  . Drug use: No    Allergies:  Allergies  Allergen Reactions  . Shrimp [Shellfish Allergy] Shortness Of Breath    No medications prior to admission.    Review of Systems  Constitutional: Positive for fatigue. Negative for fever.  Respiratory: Negative for shortness of breath.   Gastrointestinal: Negative for abdominal pain, nausea and vomiting.  Genitourinary: Negative for vaginal bleeding.  Neurological: Positive for dizziness. Negative for syncope and  weakness.  All other systems reviewed and are negative.  Physical Exam   Blood pressure 122/82, pulse 92, temperature 98.5 F (36.9 C), temperature source Oral, resp. rate 15, height 5\' 1"  (1.549 m), weight 115.6 kg, SpO2 100 %, unknown if currently breastfeeding.  Physical Exam  Nursing note and vitals reviewed. Constitutional: She is oriented to person, place, and time. She appears well-developed and well-nourished.  Cardiovascular: Normal rate and normal heart sounds.  Respiratory: Effort normal and breath sounds normal.  GI: Soft. Bowel sounds are normal. She exhibits no distension. There is no abdominal tenderness. There is no rebound, no guarding and no CVA tenderness.  Neurological: She is alert and oriented to person, place, and time.  Skin: Skin is warm and dry.  Psychiatric: She has a normal mood and affect. Her behavior is normal. Judgment and thought content normal.    MAU Course/MDM  Procedures   --Swabs collected via blind swab due to absence of bleeding --Abnormal UA, presence of squam epi suggests contamination. Culture ordered  Orders Placed This Encounter  Procedures  . OB Transvaginal    Standing Status:   Future    Standing Expiration Date:  07/25/2020    Scheduling Instructions:     Please schedule patient for follow-up US, Monday - Thursday between 8:00 am - 10:00 am and 12:00 pm - 3:00 pm. The patient will follow-up with [facility] immediately after [place] for results.    Order Specific Question:   Reason for Exam (SYMPTOM  OR DIAGNOSIS REQUIRED)    Answer:   Viability scan    Order Specific Question:   Preferred imaging location?    Answer:   [facility]    Comments:   [facility]  . Urinalysis, Routine w reflex microscopic    Standing Status:   Standing    Number of Occurrences:   1  . CBC    Standing Status:   Standing    Number of Occurrences:   1  . Comprehensive metabolic panel    Standing Status:   Standing    Number of Occurrences:   1  .  hCG, quantitative, pregnancy    Standing Status:   Standing    Number of Occurrences:   1  . Pregnancy, urine POC    Standing Status:   Standing    Number of Occurrences:   1    Patient Vitals for the past 24 hrs:  BP Temp Temp src Pulse Resp SpO2 Height Weight  05/28/19 1412 122/82 -- -- 92 15 100 % -- --  05/28/19 1216 126/81 -- -- 83 -- 99 % 5\' 1"  (1.549 m) 115.6 kg  05/28/19 1111 (!) 139/92 98.5 F (36.9 C) Oral (!) 103 17 100 % -- --   Results for orders placed or performed during the hospital encounter of 05/28/19 (from the past 24 hour(s))  Urinalysis, Routine w reflex microscopic     Status: Abnormal   Collection Time: 05/28/19 12:12 PM  Result Value Ref Range   Color, Urine YELLOW YELLOW   APPearance CLOUDY (A) CLEAR   Specific Gravity, Urine 1.020 1.005 - 1.030   pH 6.0 5.0 - 8.0   Glucose, UA NEGATIVE NEGATIVE mg/dL   Hgb urine dipstick NEGATIVE NEGATIVE   Bilirubin Urine NEGATIVE NEGATIVE   Ketones, ur NEGATIVE NEGATIVE mg/dL   Protein, ur NEGATIVE NEGATIVE mg/dL   Nitrite NEGATIVE NEGATIVE   Leukocytes,Ua SMALL (A) NEGATIVE   RBC / HPF 0-5 0 - 5 RBC/hpf   WBC, UA 0-5 0 - 5 WBC/hpf   Bacteria, UA RARE (A) NONE SEEN   Squamous Epithelial / LPF 21-50 0 - 5   Mucus PRESENT   Pregnancy, urine POC     Status: Abnormal   Collection Time: 05/28/19 12:13 PM  Result Value Ref Range   Preg Test, Ur POSITIVE (A) NEGATIVE  CBC     Status: Abnormal   Collection Time: 05/28/19 12:39 PM  Result Value Ref Range   WBC 12.6 (H) 4.0 - 10.5 K/uL   RBC 4.38 3.87 - 5.11 MIL/uL   Hemoglobin 12.6 12.0 - 15.0 g/dL   HCT 05/30/19 32.9 - 92.4 %   MCV 88.8 80.0 - 100.0 fL   MCH 28.8 26.0 - 34.0 pg   MCHC 32.4 30.0 - 36.0 g/dL   RDW 26.8 34.1 - 96.2 %   Platelets 389 150 - 400 K/uL   nRBC 0.0 0.0 - 0.2 %  Comprehensive metabolic panel     Status: Abnormal   Collection Time: 05/28/19 12:39 PM  Result Value Ref Range   Sodium 136 135 - 145 mmol/L   Potassium 4.1 3.5 - 5.1 mmol/L    Chloride 103  98 - 111 mmol/L   CO2 21 (L) 22 - 32 mmol/L   Glucose, Bld 95 70 - 99 mg/dL   BUN 7 6 - 20 mg/dL   Creatinine, Ser 0.61 0.44 - 1.00 mg/dL   Calcium 9.3 8.9 - 10.3 mg/dL   Total Protein 7.7 6.5 - 8.1 g/dL   Albumin 3.9 3.5 - 5.0 g/dL   AST 21 15 - 41 U/L   ALT 20 0 - 44 U/L   Alkaline Phosphatase 53 38 - 126 U/L   Total Bilirubin 0.6 0.3 - 1.2 mg/dL   GFR calc non Af Amer >60 >60 mL/min   GFR calc Af Amer >60 >60 mL/min   Anion gap 12 5 - 15  hCG, quantitative, pregnancy     Status: Abnormal   Collection Time: 05/28/19 12:39 PM  Result Value Ref Range   hCG, Beta Chain, Quant, S 33,124 (H) <5 mIU/mL   Meds ordered this encounter  Medications  . pyridOXINE (VITAMIN B-6) 25 MG tablet    Sig: Take 1 tablet (25 mg total) by mouth every 8 (eight) hours.    Dispense:  30 tablet    Refill:  0    Order Specific Question:   Supervising Provider    Answer:   CONSTANT, PEGGY [[id number]]  . doxylamine, Sleep, (UNISOM) 25 MG tablet    Sig: Take 1 tablet (25 mg total) by mouth 4 (four) times daily as needed (nausea and vomiting).    Dispense:  30 tablet    Refill:  2    Order Specific Question:   Supervising Provider    Answer:   CONSTANT, PEGGY [[id number]]  . promethazine (PHENERGAN) 25 MG tablet    Sig: Take 1 tablet (25 mg total) by mouth every 6 (six) hours as needed for nausea or vomiting. For nausea and vomiting not relieved by B6 and Unisom    Dispense:  30 tablet    Refill:  0    Order Specific Question:   Supervising Provider    Answer:   CONSTANT, PEGGY [[id number]]    Assessment and Plan  --24 y.o. [id number] in early pregnancy --No concerning findings on exam --New rx antiemetics, emphasized slow bland snacks, sips of room temp liquids --Outpatient viability scan ordered --Urine culture ordered --Discharge in stable condition with first trimester precautions  F/U: --Patient given list of [facility] Providers   Darlina Rumpf, [place] 05/28/2019, 3:01 PM

## 2019-05-29 ENCOUNTER — Telehealth: Payer: Self-pay | Admitting: *Deleted

## 2019-05-29 LAB — CULTURE, OB URINE

## 2019-05-29 NOTE — Telephone Encounter (Signed)
Pt went to [facility] yesterday and confirmed pregnancy. Pt would like to have her OB care here. Requesting an [place].

## 2019-06-04 ENCOUNTER — Other Ambulatory Visit: Payer: Self-pay

## 2019-06-04 ENCOUNTER — Ambulatory Visit
Admission: RE | Admit: 2019-06-04 | Discharge: 2019-06-04 | Disposition: A | Discharge status: Home / Self Care | Payer: Medicaid Other | Source: Ambulatory Visit | Attending: Advanced Practice Midwife | Admitting: Advanced Practice Midwife

## 2019-06-04 ENCOUNTER — Encounter: Payer: Self-pay | Admitting: Family Medicine

## 2019-06-04 ENCOUNTER — Other Ambulatory Visit: Payer: Self-pay | Admitting: Obstetrics and Gynecology

## 2019-06-04 ENCOUNTER — Ambulatory Visit (INDEPENDENT_AMBULATORY_CARE_PROVIDER_SITE_OTHER): Payer: Medicaid Other

## 2019-06-04 DIAGNOSIS — O219 Vomiting of pregnancy, unspecified: Secondary | ICD-10-CM | POA: Diagnosis present

## 2019-06-04 DIAGNOSIS — Z3A01 Less than 8 weeks gestation of pregnancy: Secondary | ICD-10-CM

## 2019-06-04 DIAGNOSIS — O30041 Twin pregnancy, dichorionic/diamniotic, first trimester: Secondary | ICD-10-CM

## 2019-06-04 DIAGNOSIS — Z363 Encounter for antenatal screening for malformations: Secondary | ICD-10-CM

## 2019-06-04 NOTE — Progress Notes (Signed)
Pt here today for OB US results.  Reviewed results with Luna Kitchens, CNM who recommends that  patient start taking PNV and start [facility].  Pt informed that she is pregnant with twins!! Pt very surprised with results.  Medications/allergies reviewed.  [place] pics given.  Front office to provide proof of pregnancy letter to start OB care.  Pt verbalized understanding.   Addison Naegeli, RN  06/04/19

## 2019-06-05 ENCOUNTER — Other Ambulatory Visit: Payer: Medicaid Other

## 2019-06-06 NOTE — Progress Notes (Signed)
Chart reviewed for nurse visit. Agree with plan of care.   Taneah Masri Lorraine, CNM 06/06/2019 10:04 AM   

## 2019-07-01 ENCOUNTER — Encounter (HOSPITAL_COMMUNITY): Payer: Self-pay | Admitting: Emergency Medicine

## 2019-07-01 ENCOUNTER — Encounter (HOSPITAL_COMMUNITY): Payer: Self-pay | Admitting: Obstetrics and Gynecology

## 2019-07-01 ENCOUNTER — Inpatient Hospital Stay (HOSPITAL_COMMUNITY): Payer: Medicaid Other

## 2019-07-01 ENCOUNTER — Inpatient Hospital Stay (HOSPITAL_COMMUNITY)
Admission: AD | Admit: 2019-07-01 | Discharge: 2019-07-01 | Disposition: A | Discharge status: Home / Self Care | Payer: Medicaid Other | Attending: Obstetrics and Gynecology | Admitting: Obstetrics and Gynecology

## 2019-07-01 ENCOUNTER — Emergency Department (HOSPITAL_COMMUNITY)
Admission: EM | Admit: 2019-07-01 | Discharge: 2019-07-01 | Disposition: A | Discharge status: Home / Self Care | Payer: Medicaid Other | Attending: Emergency Medicine | Admitting: Emergency Medicine

## 2019-07-01 ENCOUNTER — Other Ambulatory Visit: Payer: Self-pay

## 2019-07-01 DIAGNOSIS — Z6790 Unspecified blood type, Rh positive: Secondary | ICD-10-CM | POA: Diagnosis not present

## 2019-07-01 DIAGNOSIS — Z91013 Allergy to seafood: Secondary | ICD-10-CM | POA: Insufficient documentation

## 2019-07-01 DIAGNOSIS — Z825 Family history of asthma and other chronic lower respiratory diseases: Secondary | ICD-10-CM | POA: Diagnosis not present

## 2019-07-01 DIAGNOSIS — O209 Hemorrhage in early pregnancy, unspecified: Secondary | ICD-10-CM | POA: Insufficient documentation

## 2019-07-01 DIAGNOSIS — O30001 Twin pregnancy, unspecified number of placenta and unspecified number of amniotic sacs, first trimester: Secondary | ICD-10-CM | POA: Insufficient documentation

## 2019-07-01 DIAGNOSIS — Z803 Family history of malignant neoplasm of breast: Secondary | ICD-10-CM | POA: Insufficient documentation

## 2019-07-01 DIAGNOSIS — O039 Complete or unspecified spontaneous abortion without complication: Secondary | ICD-10-CM | POA: Insufficient documentation

## 2019-07-01 DIAGNOSIS — Z3A10 10 weeks gestation of pregnancy: Secondary | ICD-10-CM | POA: Diagnosis not present

## 2019-07-01 DIAGNOSIS — Z5321 Procedure and treatment not carried out due to patient leaving prior to being seen by health care provider: Secondary | ICD-10-CM | POA: Insufficient documentation

## 2019-07-01 DIAGNOSIS — O30041 Twin pregnancy, dichorionic/diamniotic, first trimester: Secondary | ICD-10-CM | POA: Insufficient documentation

## 2019-07-01 DIAGNOSIS — Z801 Family history of malignant neoplasm of trachea, bronchus and lung: Secondary | ICD-10-CM | POA: Diagnosis not present

## 2019-07-01 DIAGNOSIS — Z8249 Family history of ischemic heart disease and other diseases of the circulatory system: Secondary | ICD-10-CM | POA: Diagnosis not present

## 2019-07-01 DIAGNOSIS — O30049 Twin pregnancy, dichorionic/diamniotic, unspecified trimester: Secondary | ICD-10-CM

## 2019-07-01 DIAGNOSIS — N939 Abnormal uterine and vaginal bleeding, unspecified: Secondary | ICD-10-CM | POA: Diagnosis present

## 2019-07-01 LAB — CBC
HCT: 35.5 % — ABNORMAL LOW (ref 36.0–46.0)
Hemoglobin: 11.6 g/dL — ABNORMAL LOW (ref 12.0–15.0)
MCH: 28 pg (ref 26.0–34.0)
MCHC: 32.7 g/dL (ref 30.0–36.0)
MCV: 85.7 fL (ref 80.0–100.0)
Platelets: 362 10*3/uL (ref 150–400)
RBC: 4.14 MIL/uL (ref 3.87–5.11)
RDW: 13.7 % (ref 11.5–15.5)
WBC: 16.4 10*3/uL — ABNORMAL HIGH (ref 4.0–10.5)
nRBC: 0 % (ref 0.0–0.2)

## 2019-07-01 LAB — HCG, QUANTITATIVE, PREGNANCY: hCG, Beta Chain, Quant, S: 84738 m[IU]/mL — ABNORMAL HIGH (ref <5)

## 2019-07-01 MED ORDER — OXYCODONE-ACETAMINOPHEN 5-325 MG PO TABS
1.0000 | ORAL_TABLET | Freq: Once | ORAL | Status: AC
  Administered 2019-07-01: 1 via ORAL
  Filled 2019-07-01: qty 1

## 2019-07-01 MED ORDER — IBUPROFEN 600 MG PO TABS
600.0000 mg | ORAL_TABLET | Freq: Four times a day (QID) | ORAL | 0 refills | Status: DC | PRN
Start: 2019-07-01 — End: 2020-08-12

## 2019-07-01 NOTE — MAU Provider Note (Addendum)
History     CSN: [id number]  Arrival date and time: 07/01/19 1003   First Provider Initiated Contact with Patient 07/01/19 1102      Chief Complaint  Patient presents with   Vaginal Bleeding   24 y.o. [id number] @10 .0 wks with did twins presenting with VB. Bleeding started this am. Reports large amount running down legs and filling toilet. Reports abdominal cramping. Rates pain 8/10. Has not taken anything for it.    OB History    Gravida  4   Para  2   Term  2   Preterm      AB  1   Living  2     SAB  1   TAB      Ectopic      Multiple  1   Live Births  2           Past Medical History:  Diagnosis Date   Headache    Irregular bleeding 06/16/2015   Moody 06/16/2015   Nexplanon in place 06/16/2015    Past Surgical History:  Procedure Laterality Date   TONSILLECTOMY      Family History  Problem Relation Age of Onset   Hypertension Father    Asthma Father    Hypertension Maternal Grandmother    Cancer Maternal Grandmother        lung   Hypertension Maternal Grandfather    Cancer Maternal Aunt        breast    Social History   Tobacco Use   Smoking status: Never Smoker   Smokeless tobacco: Never Used  Vaping Use   Vaping Use: Never used  Substance Use Topics   Alcohol use: No   Drug use: No    Allergies:  Allergies  Allergen Reactions   Shrimp [Shellfish Allergy] Shortness Of Breath    No medications prior to admission.    Review of Systems  Gastrointestinal: Positive for abdominal pain.  Genitourinary: Positive for vaginal bleeding.   Physical Exam   Blood pressure 117/85, pulse 85, temperature 99.1 F (37.3 C), temperature source Oral, resp. rate 19, height 5\' 2"  (1.575 m), weight 107.2 kg, SpO2 99 %, unknown if currently breastfeeding.  Physical Exam  Nursing note and vitals reviewed. Constitutional: She is oriented to person, place, and time. No distress.  HENT:  Head: Normocephalic and atraumatic.   Respiratory: Effort normal. No respiratory distress.  Genitourinary:    Genitourinary Comments: External: no lesions or erythema Vagina: rugated, pink, moist, moderate bloody discharge, cleared with 3 fox swabs Cervix closed    Musculoskeletal:        General: Normal range of motion.     Cervical back: Normal range of motion.  Neurological: She is alert and oriented to person, place, and time.  Skin: Skin is warm and dry.  Psychiatric: Mood normal.   Results for orders placed or performed during the hospital encounter of 07/01/19 (from the past 24 hour(s))  CBC     Status: Abnormal   Collection Time: 07/01/19 11:39 AM  Result Value Ref Range   WBC 16.4 (H) 4.0 - 10.5 K/uL   RBC 4.14 3.87 - 5.11 MIL/uL   Hemoglobin 11.6 (L) 12.0 - 15.0 g/dL   HCT 07/03/19 (L) 36 - 46 %   MCV 85.7 80.0 - 100.0 fL   MCH 28.0 26.0 - 34.0 pg   MCHC 32.7 30.0 - 36.0 g/dL   RDW 07/03/19 22.2 - 97.9 %   Platelets 362 150 - 400  K/uL   nRBC 0.0 0.0 - 0.2 %  hCG, quantitative, pregnancy     Status: Abnormal   Collection Time: 07/01/19 11:39 AM  Result Value Ref Range   hCG, Beta Chain, Quant, S 84,738 (H) <5 mIU/mL    US OB Comp Less 14 Wks  Result Date: 07/01/2019 CLINICAL DATA:  Vaginal bleeding.  Twin pregnancy. EXAM: OBSTETRIC <14 WK ULTRASOUND TECHNIQUE: Transabdominal ultrasound was performed for evaluation of the gestation as well as the maternal uterus and adnexal regions. COMPARISON:  06/04/2019 FINDINGS: Intrauterine gestational sac: None; previously seen twin intrauterine gestations are no longer visualized. Maternal uterus/adnexae: Endometrium appears heterogeneous and measures approximately 19 mm in thickness. No fibroids identified. Both ovaries are normal in appearance. No adnexal mass or abnormal free fluid identified. IMPRESSION: Interval spontaneous abortion, with previously seen twin IUP no longer visualized. Electronically Signed   By: Marlaine Hind M.D.   On: 07/01/2019 12:10   MAU Course   Procedures Percocet 1 po  MDM Labs and [place] ordered and reviewed. Pain improved after med. [place] confirmed SAB. CBC stable. Discussed findings with pt. Condolences given. Plan for 2 week f/u. Stable for discharge home.  Assessment and Plan   1. SAB (spontaneous abortion)   2. Bleeding in early pregnancy   3. Dichorionic diamniotic twin gestation   74. Vaginal bleeding in pregnancy, first trimester   5. Blood type, Rh positive    Discharge home Follow up at [facility] OB in 2 weeks- message sent to schedule Pelvic rest Bleeding/return precautions Rx Ibuprofen  Allergies as of 07/01/2019      Reactions   Shrimp [shellfish Allergy] Shortness Of Breath      Medication List    STOP taking these medications   doxylamine (Sleep) 25 MG tablet Commonly known as: UNISOM   promethazine 25 MG tablet Commonly known as: PHENERGAN   pyridOXINE 25 MG tablet Commonly known as: VITAMIN B-6     TAKE these medications   ibuprofen 600 MG tablet Commonly known as: ADVIL Take 1 tablet (600 mg total) by mouth every 6 (six) hours as needed for cramping.       Julianne Handler, CNM 07/01/2019, 3:21 PM

## 2019-07-01 NOTE — Discharge Instructions (Signed)

## 2019-07-01 NOTE — MAU Note (Signed)
Pt reports she has had negative experiences with lab draw in the past and she is very nervous right now so she would like decline lab draw at this time. She is open to getting labs drawn later.

## 2019-07-01 NOTE — MAU Note (Signed)
Presents with c/o VB and abdominal cramping that began this morning.  Denies recent intercourse, last Wednesday.  Reports twin gestation.

## 2019-07-01 NOTE — MAU Note (Signed)
Assisted patient via wheelchair out to car

## 2019-07-01 NOTE — ED Provider Notes (Cosign Needed)
MSE was initiated and I personally evaluated the patient and placed orders (if any) at  9:35 AM on July 01, 2019.  24 year old female presents to the ED due to abdominal cramping and vaginal bleeding that started this morning.  Patient notes vaginal bleeding is heavy and fills up a maxi pad. Patient states she is roughly 10 to [redacted] weeks pregnant with twins.  Patient has had an ultrasound at [facility] with [facility] a couple weeks ago.  On 5/18 her H CG was 33,124.  This is patient's fourth pregnancy with 2 living children.  Denies fever and chills.  Upon arrival, patient is afebrile with mild tachycardia at 106, but otherwise unremarkable vitals.  Patient in no acute distress and nontoxic-appearing.  Abdomen soft, nondistended with generalized abdominal tenderness.  Discussed case with [place] at MAU who agrees to accept transfer of patient for further evaluation.  The patient appears stable so that the remainder of the MSE may be completed by another provider.   Mannie Stabile, [place] 07/01/19 [phone]

## 2019-07-01 NOTE — ED Triage Notes (Signed)
Pt. Stated, I woke up this morning with vaginal bleeding with abdominal cramping.

## 2019-07-01 NOTE — ED Notes (Signed)
[facility]red to [facility] Called transfer

## 2019-07-06 ENCOUNTER — Telehealth: Payer: Medicaid Other | Admitting: Nurse Practitioner

## 2019-07-06 DIAGNOSIS — R1084 Generalized abdominal pain: Secondary | ICD-10-CM

## 2019-07-06 NOTE — Progress Notes (Signed)
Based on what you shared with me, I feel your condition warrants further evaluation and I recommend that you be seen for a face to face office visit. Evisit do not have are not affilated with any OB/Gyn [ractice. You will either have to go  Back to the hospital of contact their office for an appointment.   NOTE: If you entered your credit card information for this eVisit, you will not be charged. You may see a "hold" on your card for the $35 but that hold will drop off and you will not have a charge processed.   If you are having a true medical emergency please call 911.      For an urgent face to face visit, [facility] has five urgent care centers for your convenience:      NEW:  [facility] at [facility] Directions [phone] [address] [place], [place] [address] . 10 am - 6pm Monday - Friday    [facility]) Get Driving Directions [phone] [address] [place], [place] [address] . 10 am to 8 pm Monday-Friday . 12 pm to 8 pm [facility] at [facility] Get Driving Directions [phone] [address] [place], [place] [address] . 8 am to 8 pm Monday-Friday . 9 am to 6 pm Saturday . 11 am to 6 pm Sunday     [facility] at [facility] Get Driving Directions  [phone] [address] [place], [place] [address] . 8 am to 8 pm Monday-Friday . 8 am to 4 pm [facility] Urgent Care at [facility] Directions [phone] [address] [place], [place] [address] . 12 pm to 6 pm Monday-Friday      Your e-visit answers were reviewed by a board certified advanced clinical practitioner to complete your personal care plan.  Thank you for using e-Visits.

## 2019-07-10 ENCOUNTER — Encounter: Payer: Medicaid Other | Admitting: Advanced Practice Midwife

## 2019-07-10 ENCOUNTER — Ambulatory Visit: Payer: Medicaid Other | Admitting: *Deleted

## 2019-07-10 ENCOUNTER — Other Ambulatory Visit: Payer: Medicaid Other

## 2019-07-16 ENCOUNTER — Ambulatory Visit (INDEPENDENT_AMBULATORY_CARE_PROVIDER_SITE_OTHER): Payer: Medicaid Other | Admitting: Women's Health

## 2019-07-16 ENCOUNTER — Encounter: Payer: Self-pay | Admitting: Women's Health

## 2019-07-16 ENCOUNTER — Other Ambulatory Visit (HOSPITAL_COMMUNITY)
Admission: RE | Admit: 2019-07-16 | Discharge: 2019-07-16 | Disposition: A | Discharge status: Home / Self Care | Payer: Medicaid Other | Source: Ambulatory Visit | Attending: Obstetrics & Gynecology | Admitting: Obstetrics & Gynecology

## 2019-07-16 ENCOUNTER — Other Ambulatory Visit: Payer: Self-pay

## 2019-07-16 VITALS — BP 120/85 | HR 97 | Ht 67.0 in | Wt 244.0 lb

## 2019-07-16 DIAGNOSIS — N939 Abnormal uterine and vaginal bleeding, unspecified: Secondary | ICD-10-CM | POA: Diagnosis present

## 2019-07-16 DIAGNOSIS — R102 Pelvic and perineal pain: Secondary | ICD-10-CM | POA: Diagnosis present

## 2019-07-16 DIAGNOSIS — O039 Complete or unspecified spontaneous abortion without complication: Secondary | ICD-10-CM

## 2019-07-16 DIAGNOSIS — Z5189 Encounter for other specified aftercare: Secondary | ICD-10-CM

## 2019-07-16 MED ORDER — NORETHINDRONE 0.35 MG PO TABS
1.0000 | ORAL_TABLET | Freq: Every day | ORAL | 3 refills | Status: DC
Start: 2019-07-16 — End: 2019-10-16

## 2019-07-16 MED ORDER — NAPROXEN SODIUM 550 MG PO TABS: 550.0000 mg | ORAL_TABLET | Freq: Two times a day (BID) | ORAL | 0 refills | Status: DC | PRN

## 2019-07-16 NOTE — Progress Notes (Signed)
GYN VISIT Patient name: Catherine Le MRN [id number]  Date of birth: 06/13/1995 Chief Complaint:   Miscarriage (still bleeding and cramping)  History of Present Illness:   Catherine Le is a 24 y.o. [phone] African American female being seen today for f/u SAB of di-di twins at 10wks. Went to MAU 6/21 w/ bleeding/cramping, u/s revealed empty uterus where there had been di-di IUP 4wks earlier.  Still cramping/bleeding, cramps are 8/10 at the worst. Ibuprofen, apap not helping. Took a lysteda she had at home to help with the bleeding, and it did slow it some. Denies fever/chills. Does reports d/c w/ odor, no itching/irritation. Has not been sexually active since miscarriage. Concerned about her bp's, very high at home w/ electric cuff, had her mom come over and take w/ manual cuff and still high. Normal here x 2 today, normal bp's on review of notes in Epic. States it was high when she went to [facility] in June w/ miscarriage. Does have strong family h/o HTN.  No flowsheet data found.  No LMP recorded. Patient is pregnant. The current method of family planning is abstinence, does want birth control. Reviewed all options, she wants pills. Has migraines w/ aura, so discussed POPs, has taken in past and did well w/ them. Last pap never- reports she had one here after her last baby (2017), no record of this in EPIC. Results were:  n/a Review of Systems:   Pertinent items are noted in HPI Denies fever/chills, dizziness, headaches, visual disturbances, fatigue, shortness of breath, chest pain, abdominal pain, vomiting, abnormal vaginal discharge/itching/odor/irritation, problems with periods, bowel movements, urination, or intercourse unless otherwise stated above.  Pertinent History Reviewed:  Reviewed past medical,surgical, social, obstetrical and family history.  Reviewed problem list, medications and allergies. Physical Assessment:   Vitals:   07/16/19 1045  BP: 120/85  Pulse: 97  Weight: 244  lb (110.7 kg)  Height: 5\' 7"  (1.702 m)  Body mass index is 38.22 kg/m.       Physical Examination:   General appearance: alert, well appearing, and in no distress  Mental status: alert, oriented to person, place, and time  Skin: warm & dry   Cardiovascular: normal heart rate noted  Respiratory: normal respiratory effort, no distress  Abdomen: soft, non-tender   Pelvic: VULVA: normal appearing vulva with no masses, tenderness or lesions, VAGINA: normal appearing vagina with normal color and discharge, no lesions, small amount pink discharge CERVIX: appears closed, normal appearing cervix without discharge or lesions, UTERUS: uterus is normal size, shape, consistency and nontender, ADNEXA: normal adnexa in size, nontender and no masses  Extremities: no edema   Chaperone: Latisha Cresenzo    No results found for this or any previous visit (from the past 24 hour(s)).  Assessment & Plan:  1) Recent twin SAB @ 10wks> still reports cramping/bleeding, exam normal today, only small amt pink discharge. CV swab sent. Rx anaprox for cramping. Continue pelvic rest until sx resolve.   2) Elevated bp's at home> normal here, to bring cuff w/ her to next visit  3) Needs pap> schedule today  Meds:  Meds ordered this encounter  Medications  . norethindrone (MICRONOR) 0.35 MG tablet    Sig: Take 1 tablet (0.35 mg total) by mouth daily.    Dispense:  90 tablet    Refill:  3    Order Specific Question:   Supervising Provider    Answer:   , LUTHER H [[id number]]  . naproxen sodium (ANAPROX) 550 MG  tablet    Sig: Take 1 tablet (550 mg total) by mouth 2 (two) times daily as needed for mild pain or moderate pain.    Dispense:  30 tablet    Refill:  0    Order Specific Question:   Supervising Provider    Answer:   Despina Hidden, LUTHER H [[id number]]    No orders of the defined types were placed in this encounter.   Return for 1st available, Pap & physical.  Cheral Marker CNM, [facility] 07/16/2019 1054

## 2019-07-16 NOTE — Patient Instructions (Signed)
Bring your blood pressure cuff to your next visit

## 2019-07-17 ENCOUNTER — Encounter: Payer: Self-pay | Admitting: Women's Health

## 2019-07-17 ENCOUNTER — Other Ambulatory Visit: Payer: Self-pay | Admitting: Women's Health

## 2019-07-17 DIAGNOSIS — A749 Chlamydial infection, unspecified: Secondary | ICD-10-CM | POA: Insufficient documentation

## 2019-07-17 LAB — CERVICOVAGINAL ANCILLARY ONLY
Bacterial Vaginitis (gardnerella): POSITIVE — AB
Candida Glabrata: NEGATIVE
Candida Vaginitis: NEGATIVE
Chlamydia: POSITIVE — AB
Comment: NEGATIVE
Comment: NEGATIVE
Comment: NEGATIVE
Comment: NEGATIVE
Comment: NEGATIVE
Comment: NORMAL
Neisseria Gonorrhea: NEGATIVE
Trichomonas: NEGATIVE

## 2019-07-17 MED ORDER — AZITHROMYCIN 500 MG PO TABS
1000.0000 mg | ORAL_TABLET | Freq: Once | ORAL | 0 refills | Status: AC
Start: 2019-07-17 — End: 2019-07-17

## 2019-07-17 MED ORDER — METRONIDAZOLE 500 MG PO TABS
500.0000 mg | ORAL_TABLET | Freq: Two times a day (BID) | ORAL | 0 refills | Status: DC
Start: 2019-07-17 — End: 2020-06-22

## 2019-07-25 ENCOUNTER — Telehealth: Payer: Self-pay | Admitting: Women's Health

## 2019-07-25 NOTE — Telephone Encounter (Signed)
Called azithromycin 500 mg @ 2 po now for [employer] dob [birth year] to [facility] [address] [phone]

## 2019-07-25 NOTE — Telephone Encounter (Signed)
Patient called our after hours nurse and left a message stating that she would like a call back from our office regarding a refill of her medication. Please contact pt

## 2019-07-25 NOTE — Telephone Encounter (Signed)
Telephoned patient at home number. Patient states was positive for chlamydia and wants partner treated also. Catherine Le DOB [birth year]. Uses [facility] [phone]. Advised to check with pharmacy later. Patient voiced understanding.

## 2019-07-25 NOTE — Telephone Encounter (Signed)
Pt would like for a nurse to give her a call °

## 2019-08-09 ENCOUNTER — Encounter: Payer: Self-pay | Admitting: *Deleted

## 2019-08-26 ENCOUNTER — Other Ambulatory Visit: Payer: Medicaid Other | Admitting: Women's Health

## 2019-09-04 ENCOUNTER — Other Ambulatory Visit: Payer: Medicaid Other | Admitting: Obstetrics and Gynecology

## 2019-10-16 ENCOUNTER — Telehealth: Payer: Self-pay

## 2019-10-16 MED ORDER — MEGESTROL ACETATE 40 MG PO TABS
ORAL_TABLET | ORAL | 1 refills | Status: DC
Start: 2019-10-16 — End: 2020-08-12

## 2019-10-16 NOTE — Telephone Encounter (Signed)
Pt miscarried in June. Pt got Nexplanon placed last week of June or first week of July. Pt is having irregular bleeding and is requesting Megace. Thanks!! JSY

## 2019-10-16 NOTE — Telephone Encounter (Signed)
Pt is wanting a refill of tranexamic acid 1300 mg sent to the [facility] pharmacy on [address] in [place]. She stated that she has had contin. Bleeding since her nexplanon implant.

## 2019-10-16 NOTE — Telephone Encounter (Signed)
Pt complains of bleeding with nexplanon, bled through clothes, will rx megace

## 2019-12-08 ENCOUNTER — Encounter (HOSPITAL_COMMUNITY): Payer: Self-pay | Admitting: *Deleted

## 2019-12-08 ENCOUNTER — Other Ambulatory Visit: Payer: Self-pay

## 2019-12-08 ENCOUNTER — Emergency Department (HOSPITAL_COMMUNITY)
Admission: EM | Admit: 2019-12-08 | Discharge: 2019-12-08 | Disposition: A | Discharge status: Home / Self Care | Payer: Medicaid Other | Attending: Emergency Medicine | Admitting: Emergency Medicine

## 2019-12-08 DIAGNOSIS — N939 Abnormal uterine and vaginal bleeding, unspecified: Secondary | ICD-10-CM | POA: Diagnosis present

## 2019-12-08 DIAGNOSIS — N938 Other specified abnormal uterine and vaginal bleeding: Secondary | ICD-10-CM | POA: Diagnosis not present

## 2019-12-08 DIAGNOSIS — R000 Tachycardia, unspecified: Secondary | ICD-10-CM | POA: Diagnosis not present

## 2019-12-08 LAB — URINALYSIS, ROUTINE W REFLEX MICROSCOPIC
Bilirubin Urine: NEGATIVE
Glucose, UA: NEGATIVE mg/dL
Hgb urine dipstick: NEGATIVE
Ketones, ur: NEGATIVE mg/dL
Leukocytes,Ua: NEGATIVE
Nitrite: NEGATIVE
Protein, ur: NEGATIVE mg/dL
Specific Gravity, Urine: 1.013 (ref 1.005–1.030)
pH: 6 (ref 5.0–8.0)

## 2019-12-08 LAB — BASIC METABOLIC PANEL
Anion gap: 12 (ref 5–15)
BUN: 5 mg/dL — ABNORMAL LOW (ref 6–20)
CO2: 23 mmol/L (ref 22–32)
Calcium: 9.4 mg/dL (ref 8.9–10.3)
Chloride: 103 mmol/L (ref 98–111)
Creatinine, Ser: 0.59 mg/dL (ref 0.44–1.00)
GFR, Estimated: >60 mL/min (ref >60)
Glucose, Bld: 101 mg/dL — ABNORMAL HIGH (ref 70–99)
Potassium: 3.4 mmol/L — ABNORMAL LOW (ref 3.5–5.1)
Sodium: 138 mmol/L (ref 135–145)

## 2019-12-08 LAB — HCG, QUANTITATIVE, PREGNANCY: hCG, Beta Chain, Quant, S: 1 m[IU]/mL (ref <5)

## 2019-12-08 LAB — WET PREP, GENITAL
Clue Cells Wet Prep HPF POC: NONE SEEN
Sperm: NONE SEEN
Trich, Wet Prep: NONE SEEN
Yeast Wet Prep HPF POC: NONE SEEN

## 2019-12-08 LAB — CBC WITH DIFFERENTIAL/PLATELET
Abs Immature Granulocytes: 0.03 10*3/uL (ref 0.00–0.07)
Basophils Absolute: 0.1 10*3/uL (ref 0.0–0.1)
Basophils Relative: 0 %
Eosinophils Absolute: 0.1 10*3/uL (ref 0.0–0.5)
Eosinophils Relative: 1 %
HCT: 36.9 % (ref 36.0–46.0)
Hemoglobin: 11.7 g/dL — ABNORMAL LOW (ref 12.0–15.0)
Immature Granulocytes: 0 %
Lymphocytes Relative: 25 %
Lymphs Abs: 3.3 10*3/uL (ref 0.7–4.0)
MCH: 28.2 pg (ref 26.0–34.0)
MCHC: 31.7 g/dL (ref 30.0–36.0)
MCV: 88.9 fL (ref 80.0–100.0)
Monocytes Absolute: 1 10*3/uL (ref 0.1–1.0)
Monocytes Relative: 7 %
Neutro Abs: 8.6 10*3/uL — ABNORMAL HIGH (ref 1.7–7.7)
Neutrophils Relative %: 67 %
Platelets: 431 10*3/uL — ABNORMAL HIGH (ref 150–400)
RBC: 4.15 MIL/uL (ref 3.87–5.11)
RDW: 15.2 % (ref 11.5–15.5)
WBC: 12.9 10*3/uL — ABNORMAL HIGH (ref 4.0–10.5)
nRBC: 0 % (ref 0.0–0.2)

## 2019-12-08 LAB — I-STAT BETA HCG BLOOD, ED (MC, WL, AP ONLY): I-stat hCG, quantitative: <5 m[IU]/mL (ref <5)

## 2019-12-08 MED ORDER — NAPROXEN SODIUM 550 MG PO TABS
550.0000 mg | ORAL_TABLET | Freq: Two times a day (BID) | ORAL | 0 refills | Status: DC | PRN
Start: 2019-12-08 — End: 2020-08-12

## 2019-12-08 MED ORDER — NAPROXEN SODIUM 550 MG PO TABS
550.0000 mg | ORAL_TABLET | Freq: Two times a day (BID) | ORAL | 0 refills | Status: DC | PRN
Start: 2019-12-08 — End: 2019-12-08

## 2019-12-08 MED ORDER — SODIUM CHLORIDE 0.9 % IV BOLUS
1000.0000 mL | Freq: Once | INTRAVENOUS | Status: AC
  Administered 2019-12-08: 1000 mL via INTRAVENOUS

## 2019-12-08 NOTE — ED Provider Notes (Signed)
[facility] EMERGENCY DEPARTMENT Provider Note   CSN: [id number] Arrival date & time: 12/08/19  0435     History Chief Complaint  Patient presents with  . Vaginal Bleeding    Catherine Le is a 24 y.o. female.  Pt presents to the ED today with vaginal bleeding.  Pt said she has taken several pregnancy tests at home which have been positive.  She has not seen an ob yet for this pregnancy.  This morning, she woke up with blood on her underwear.  She has a little cramping, but no severe pain.  Pt has a hx of irregular bleeding since her Nexplanon was placed in June.        Past Medical History:  Diagnosis Date  . Headache   . Irregular bleeding 06/16/2015  . Moody 06/16/2015  . Nexplanon in place 06/16/2015    Patient Active Problem List   Diagnosis Date Noted  . Chlamydia 07/17/2019  . Moody 06/16/2015  . Irregular bleeding 06/16/2015    Past Surgical History:  Procedure Laterality Date  . TONSILLECTOMY       OB History    Gravida  4   Para  2   Term  2   Preterm      AB  1   Living  2     SAB  1   TAB      Ectopic      Multiple  1   Live Births  2           Family History  Problem Relation Age of Onset  . Hypertension Father   . Asthma Father   . Hypertension Maternal Grandmother   . Cancer Maternal Grandmother        lung  . Hypertension Maternal Grandfather   . Cancer Maternal Aunt        breast    Social History   Tobacco Use  . Smoking status: Never Smoker  . Smokeless tobacco: Never Used  Vaping Use  . Vaping Use: Never used  Substance Use Topics  . Alcohol use: No  . Drug use: No    Home Medications Prior to Admission medications   Medication Sig Start Date End Date Taking? Authorizing Provider  ibuprofen (ADVIL) 600 MG tablet Take 1 tablet (600 mg total) by mouth every 6 (six) hours as needed for cramping. 07/01/19   Donette Larry, CNM  megestrol (MEGACE) 40 MG tablet Take 3 x 5 days then 2  x 5 days then 1 daily 10/16/19   Adline Potter, NP  metroNIDAZOLE (FLAGYL) 500 MG tablet Take 1 tablet (500 mg total) by mouth 2 (two) times daily. 07/17/19   Cheral Marker, CNM  naproxen sodium (ANAPROX) 550 MG tablet Take 1 tablet (550 mg total) by mouth 2 (two) times daily as needed for mild pain or moderate pain. 12/08/19   Jacalyn Lefevre, MD    Allergies    Shrimp [shellfish allergy]  Review of Systems   Review of Systems  Genitourinary: Positive for vaginal bleeding.  All other systems reviewed and are negative.   Physical Exam Updated Vital Signs BP 132/90 (BP Location: Right Arm)   Pulse 81   Temp 98.9 F (37.2 C) (Oral)   Resp 18   Ht 5\' 2"  (1.575 m)   Wt 110.7 kg   SpO2 99%   BMI 44.64 kg/m   Physical Exam Vitals and nursing note reviewed. Exam conducted with a chaperone present.  Constitutional:  Appearance: Normal appearance.  HENT:     Head: Normocephalic and atraumatic.     Right Ear: External ear normal.     Left Ear: External ear normal.     Nose: Nose normal.     Mouth/Throat:     Mouth: Mucous membranes are moist.     Pharynx: Oropharynx is clear.  Eyes:     Extraocular Movements: Extraocular movements intact.     Conjunctiva/sclera: Conjunctivae normal.     Pupils: Pupils are equal, round, and reactive to light.  Cardiovascular:     Rate and Rhythm: Regular rhythm. Tachycardia present.     Pulses: Normal pulses.     Heart sounds: Normal heart sounds.  Pulmonary:     Effort: Pulmonary effort is normal.     Breath sounds: Normal breath sounds.  Abdominal:     General: Abdomen is flat. Bowel sounds are normal.     Palpations: Abdomen is soft.  Genitourinary:    Exam position: Lithotomy position.     Vagina: Normal.     Cervix: Normal.     Uterus: Normal.      Adnexa: Right adnexa normal and left adnexa normal.     Comments: Small amt dried brown blood in vault Musculoskeletal:        General: Normal range of motion.      Cervical back: Normal range of motion and neck supple.  Skin:    General: Skin is warm.     Capillary Refill: Capillary refill takes less than 2 seconds.  Neurological:     General: No focal deficit present.     Mental Status: She is alert and oriented to person, place, and time.  Psychiatric:        Mood and Affect: Mood normal.        Behavior: Behavior normal.        Thought Content: Thought content normal.        Judgment: Judgment normal.     ED Results / Procedures / Treatments   Labs (all labs ordered are listed, but only abnormal results are displayed) Labs Reviewed  WET PREP, GENITAL - Abnormal; Notable for the following components:      Result Value   WBC, Wet Prep HPF POC MODERATE (*)    All other components within normal limits  BASIC METABOLIC PANEL - Abnormal; Notable for the following components:   Potassium 3.4 (*)    Glucose, Bld 101 (*)    BUN 5 (*)    All other components within normal limits  CBC WITH DIFFERENTIAL/PLATELET - Abnormal; Notable for the following components:   WBC 12.9 (*)    Hemoglobin 11.7 (*)    Platelets 431 (*)    Neutro Abs 8.6 (*)    All other components within normal limits  HCG, QUANTITATIVE, PREGNANCY  URINALYSIS, ROUTINE W REFLEX MICROSCOPIC  I-STAT BETA HCG BLOOD, ED ([facility], [facility], [facility] ONLY)  I-STAT BETA HCG BLOOD, ED ([facility], [facility], [facility] ONLY)  ABO/RH  GC/CHLAMYDIA PROBE AMP ([place]) NOT AT [facility]    EKG None  Radiology No results found.  Procedures Procedures (including critical care time)  Medications Ordered in ED Medications  sodium chloride 0.9 % bolus 1,000 mL (0 mLs Intravenous Stopped 12/08/19 0959)    ED Course  I have reviewed the triage vital signs and the nursing notes.  Pertinent labs & imaging results that were available during my care of the patient were reviewed by me and considered in my medical decision  making (see chart for details).    MDM Rules/Calculators/A&P                          Pt is not  pregnant.  We did 2 blood tests as she did not believe she was not pregnant.  Pt is to f/u with her obgyn.  Return if worse.  Final Clinical Impression(s) / ED Diagnoses Final diagnoses:  Dysfunctional uterine bleeding    Rx / DC Orders ED Discharge Orders         Ordered    naproxen sodium (ANAPROX) 550 MG tablet  2 times daily PRN        12/08/19 1115           Jacalyn Lefevre, MD 12/08/19 1117

## 2019-12-08 NOTE — ED Notes (Signed)
Pt requested her boyfriend's mom not be allowed back to be her visitor. She stated she " didn't care for her" and didn't care for her to come back. RN notified.

## 2019-12-08 NOTE — ED Triage Notes (Signed)
The pt  Has had a positive pregnancy test and tonight she had a sharp pain in her abd then started cramping she has irregular periods  lmp unsure

## 2019-12-09 LAB — GC/CHLAMYDIA PROBE AMP (~~LOC~~) NOT AT ARMC
Chlamydia: NEGATIVE
Comment: NEGATIVE
Comment: NORMAL
Neisseria Gonorrhea: NEGATIVE

## 2019-12-09 LAB — ABO/RH: ABO/RH(D): A POS

## 2019-12-25 ENCOUNTER — Telehealth: Payer: Self-pay | Admitting: Women's Health

## 2019-12-25 NOTE — Telephone Encounter (Signed)
Pt reports that she feels like she has a uti possibly. She feels irritation and burning when she urinates.

## 2019-12-27 ENCOUNTER — Other Ambulatory Visit: Payer: Medicaid Other

## 2020-06-18 ENCOUNTER — Other Ambulatory Visit (HOSPITAL_COMMUNITY)
Admission: RE | Admit: 2020-06-18 | Discharge: 2020-06-18 | Disposition: A | Discharge status: Home / Self Care | Payer: Medicaid Other | Source: Ambulatory Visit | Attending: Obstetrics & Gynecology | Admitting: Obstetrics & Gynecology

## 2020-06-18 ENCOUNTER — Other Ambulatory Visit: Payer: Self-pay

## 2020-06-18 ENCOUNTER — Other Ambulatory Visit (INDEPENDENT_AMBULATORY_CARE_PROVIDER_SITE_OTHER): Payer: Medicaid Other

## 2020-06-18 DIAGNOSIS — Z113 Encounter for screening for infections with a predominantly sexual mode of transmission: Secondary | ICD-10-CM

## 2020-06-18 DIAGNOSIS — N926 Irregular menstruation, unspecified: Secondary | ICD-10-CM

## 2020-06-18 DIAGNOSIS — R399 Unspecified symptoms and signs involving the genitourinary system: Secondary | ICD-10-CM | POA: Diagnosis not present

## 2020-06-18 LAB — POCT URINALYSIS DIPSTICK OB
Glucose, UA: NEGATIVE
Ketones, UA: NEGATIVE
Nitrite, UA: NEGATIVE

## 2020-06-18 LAB — POCT URINE PREGNANCY: Preg Test, Ur: NEGATIVE

## 2020-06-18 NOTE — Progress Notes (Addendum)
   NURSE VISIT- VAGINITIS/STD/POC  SUBJECTIVE:  Catherine Le is a 25 y.o. [id number] GYN patientfemale here for a vaginal swab for vaginitis screening, STD screen.  She reports the following symptoms: pain for 2 weeks. Denies abnormal vaginal bleeding, significant pelvic pain, fever, or UTI symptoms.  OBJECTIVE:  There were no vitals taken for this visit.  Appears well, in no apparent distress  ASSESSMENT: Vaginal swab for  vaginitis & STD screening  PLAN: Self-collected vaginal probe for Gonorrhea, Chlamydia, Trichomonas, Bacterial Vaginosis, Yeast sent to lab Treatment: to be determined once results are received Follow-up as needed if symptoms persist/worsen, or new symptoms develop    NURSE VISIT- UTI SYMPTOMS   SUBJECTIVE:  Catherine Le is a 25 y.o. [phone] female here for UTI symptoms. She is a GYN patient. She reports  irritation after urination .  OBJECTIVE:  There were no vitals taken for this visit.  Appears well, in no apparent distress  Results for orders placed or performed in visit on 06/18/20 (from the past 24 hour(s))  POC Urinalysis Dipstick OB   Collection Time: 06/18/20  4:20 PM  Result Value Ref Range   Color, UA     Clarity, UA     Glucose, UA Negative Negative   Bilirubin, UA     Ketones, UA neg    Spec Grav, UA     Blood, UA mod    pH, UA     POC,PROTEIN,UA Small (1+) Negative, Trace, Small (1+), Moderate (2+), Large (3+), 4+   Urobilinogen, UA     Nitrite, UA neg    Leukocytes, UA Small (1+) (A) Negative   Appearance     Odor    POCT urine pregnancy   Collection Time: 06/18/20  4:27 PM  Result Value Ref Range   Preg Test, Ur Negative Negative    ASSESSMENT: GYN patient with UTI symptoms and negative nitrites  PLAN: Note routed to Cathie Beams, CNM   Rx sent by provider today: No Urine culture sent Call or return to clinic prn if these symptoms worsen or fail to improve as anticipated. Follow-up: as needed   Yuvaan Olander A  Parv Manthey  06/18/2020 4:32 PM   Chart reviewed for nurse visit. Agree with plan of care.  Jacklyn Shell, [place] 06/18/2020 7:30 PM

## 2020-06-21 LAB — CERVICOVAGINAL ANCILLARY ONLY
Bacterial Vaginitis (gardnerella): POSITIVE — AB
Candida Glabrata: NEGATIVE
Candida Vaginitis: POSITIVE — AB
Chlamydia: NEGATIVE
Comment: NEGATIVE
Comment: NEGATIVE
Comment: NEGATIVE
Comment: NEGATIVE
Comment: NEGATIVE
Comment: NORMAL
Neisseria Gonorrhea: NEGATIVE
Trichomonas: NEGATIVE

## 2020-06-22 ENCOUNTER — Other Ambulatory Visit: Payer: Self-pay | Admitting: Women's Health

## 2020-06-22 MED ORDER — METRONIDAZOLE 500 MG PO TABS: 500.0000 mg | ORAL_TABLET | Freq: Two times a day (BID) | ORAL | 0 refills | Status: DC

## 2020-06-22 MED ORDER — FLUCONAZOLE 150 MG PO TABS: 150.0000 mg | ORAL_TABLET | Freq: Once | ORAL | 0 refills | Status: AC

## 2020-06-24 LAB — URINE CULTURE

## 2020-06-29 ENCOUNTER — Other Ambulatory Visit: Payer: Self-pay | Admitting: Women's Health

## 2020-06-29 MED ORDER — SULFAMETHOXAZOLE-TRIMETHOPRIM 800-160 MG PO TABS: 1.0000 | ORAL_TABLET | Freq: Two times a day (BID) | ORAL | 0 refills | Status: DC

## 2020-07-01 ENCOUNTER — Telehealth: Payer: Self-pay | Admitting: Obstetrics & Gynecology

## 2020-07-01 ENCOUNTER — Other Ambulatory Visit: Payer: Self-pay

## 2020-07-01 NOTE — Telephone Encounter (Signed)
Returned pt's call to answer questions related to test results and medications. Pt confirmed understanding.

## 2020-07-01 NOTE — Telephone Encounter (Signed)
Patient called stating that she would like a call back from nurse regarding her lab work, please contact pt

## 2020-08-11 ENCOUNTER — Inpatient Hospital Stay (HOSPITAL_COMMUNITY)
Admission: EM | Admit: 2020-08-11 | Discharge: 2020-08-12 | Disposition: A | Discharge status: Home / Self Care | Payer: Medicaid Other | Attending: Obstetrics and Gynecology | Admitting: Obstetrics and Gynecology

## 2020-08-11 ENCOUNTER — Other Ambulatory Visit: Payer: Self-pay

## 2020-08-11 ENCOUNTER — Encounter (HOSPITAL_COMMUNITY): Payer: Self-pay | Admitting: Obstetrics and Gynecology

## 2020-08-11 DIAGNOSIS — R1030 Lower abdominal pain, unspecified: Secondary | ICD-10-CM | POA: Insufficient documentation

## 2020-08-11 DIAGNOSIS — O3680X0 Pregnancy with inconclusive fetal viability, not applicable or unspecified: Secondary | ICD-10-CM | POA: Diagnosis not present

## 2020-08-11 DIAGNOSIS — O26891 Other specified pregnancy related conditions, first trimester: Secondary | ICD-10-CM | POA: Diagnosis present

## 2020-08-11 DIAGNOSIS — Z3A01 Less than 8 weeks gestation of pregnancy: Secondary | ICD-10-CM | POA: Insufficient documentation

## 2020-08-11 DIAGNOSIS — N8312 Corpus luteum cyst of left ovary: Secondary | ICD-10-CM | POA: Insufficient documentation

## 2020-08-11 LAB — I-STAT BETA HCG BLOOD, ED (MC, WL, AP ONLY): I-stat hCG, quantitative: 798.1 m[IU]/mL — ABNORMAL HIGH (ref <5)

## 2020-08-11 LAB — COMPREHENSIVE METABOLIC PANEL
ALT: 19 U/L (ref 0–44)
AST: 18 U/L (ref 15–41)
Albumin: 3.8 g/dL (ref 3.5–5.0)
Alkaline Phosphatase: 59 U/L (ref 38–126)
Anion gap: 9 (ref 5–15)
BUN: 5 mg/dL — ABNORMAL LOW (ref 6–20)
CO2: 23 mmol/L (ref 22–32)
Calcium: 9.1 mg/dL (ref 8.9–10.3)
Chloride: 103 mmol/L (ref 98–111)
Creatinine, Ser: 0.73 mg/dL (ref 0.44–1.00)
GFR, Estimated: >60 mL/min (ref >60)
Glucose, Bld: 87 mg/dL (ref 70–99)
Potassium: 3.4 mmol/L — ABNORMAL LOW (ref 3.5–5.1)
Sodium: 135 mmol/L (ref 135–145)
Total Bilirubin: 0.7 mg/dL (ref 0.3–1.2)
Total Protein: 7.7 g/dL (ref 6.5–8.1)

## 2020-08-11 LAB — URINALYSIS, ROUTINE W REFLEX MICROSCOPIC
Bilirubin Urine: NEGATIVE
Glucose, UA: NEGATIVE mg/dL
Hgb urine dipstick: NEGATIVE
Ketones, ur: NEGATIVE mg/dL
Leukocytes,Ua: NEGATIVE
Nitrite: NEGATIVE
Protein, ur: NEGATIVE mg/dL
Specific Gravity, Urine: 1.025 (ref 1.005–1.030)
pH: 6 (ref 5.0–8.0)

## 2020-08-11 LAB — CBC WITH DIFFERENTIAL/PLATELET
Abs Immature Granulocytes: 0.04 10*3/uL (ref 0.00–0.07)
Basophils Absolute: 0.1 10*3/uL (ref 0.0–0.1)
Basophils Relative: 0 %
Eosinophils Absolute: 0.1 10*3/uL (ref 0.0–0.5)
Eosinophils Relative: 1 %
HCT: 38.8 % (ref 36.0–46.0)
Hemoglobin: 12.2 g/dL (ref 12.0–15.0)
Immature Granulocytes: 0 %
Lymphocytes Relative: 25 %
Lymphs Abs: 3.3 10*3/uL (ref 0.7–4.0)
MCH: 28.4 pg (ref 26.0–34.0)
MCHC: 31.4 g/dL (ref 30.0–36.0)
MCV: 90.2 fL (ref 80.0–100.0)
Monocytes Absolute: 1 10*3/uL (ref 0.1–1.0)
Monocytes Relative: 7 %
Neutro Abs: 9.1 10*3/uL — ABNORMAL HIGH (ref 1.7–7.7)
Neutrophils Relative %: 67 %
Platelets: 441 10*3/uL — ABNORMAL HIGH (ref 150–400)
RBC: 4.3 MIL/uL (ref 3.87–5.11)
RDW: 14.7 % (ref 11.5–15.5)
WBC: 13.6 10*3/uL — ABNORMAL HIGH (ref 4.0–10.5)
nRBC: 0 % (ref 0.0–0.2)

## 2020-08-11 LAB — LIPASE, BLOOD: Lipase: 22 U/L (ref 11–51)

## 2020-08-11 NOTE — ED Triage Notes (Signed)
Pt c/o abdominal cramping since Saturday without vaginal bleeding/discharge. Pt states feeling lightheaded when changing positions. [facility] July 11th. Denies NVD, No urinary symptoms. NO IUD. Uses oral contraceptive.

## 2020-08-11 NOTE — MAU Note (Signed)
Catherine Le is a 25 y.o. at [redacted]w[redacted]d here in MAU reporting: abdominal cramping since Saturday, states it got worse on Monday. Pain is intermittent. No vaginal bleeding or discharge.  LMP: 07/14/20  Onset of complaint: ongoing  Pain score: 8/10  Vitals:   08/11/20 2056 08/11/20 2353  BP: (!) 146/99 122/67  Pulse: 95 72  Resp: 20 16  Temp: 99 F (37.2 C) 98.9 F (37.2 C)  SpO2: 97% 100%     Lab orders placed from triage: none

## 2020-08-11 NOTE — ED Provider Notes (Addendum)
Emergency Medicine Provider Triage Evaluation Note  Catherine Le , a 25 y.o. female  was evaluated in triage.  Pt complains of abd cramping.  Review of Systems  Positive: Abd cramping Negative: Nv, vaginal bleeding  Physical Exam  BP (!) 146/99 (BP Location: Right Arm)   Pulse 95   Temp 99 F (37.2 C) (Oral)   Resp 20   SpO2 97%  Gen:   Awake, no distress   Resp:  Normal effort  MSK:   Moves extremities without difficulty  Other:  lower abd ttp  Medical Decision Making  Medically screening exam initiated at 8:58 PM.  Appropriate orders placed.  Catherine Le was informed that the remainder of the evaluation will be completed by another provider, this initial triage assessment does not replace that evaluation, and the importance of remaining in the ED until their evaluation is complete.  9:59 PM Discussed case with Catherine Le at [facility] who accepts patient for transfer   Catherine Le 08/11/20 2059    Catherine Sprout, MD 08/11/20 2143    Catherine Le 08/11/20 2159    Catherine Sprout, MD 08/11/20 2208

## 2020-08-11 NOTE — ED Notes (Signed)
Report given to [facility] RN at MAU. Transport requested for pt.

## 2020-08-12 ENCOUNTER — Inpatient Hospital Stay (HOSPITAL_COMMUNITY): Payer: Medicaid Other

## 2020-08-12 ENCOUNTER — Encounter (HOSPITAL_COMMUNITY): Payer: Self-pay | Admitting: Obstetrics and Gynecology

## 2020-08-12 DIAGNOSIS — O3680X0 Pregnancy with inconclusive fetal viability, not applicable or unspecified: Secondary | ICD-10-CM

## 2020-08-12 LAB — WET PREP, GENITAL
Clue Cells Wet Prep HPF POC: NONE SEEN
Sperm: NONE SEEN
Trich, Wet Prep: NONE SEEN
Yeast Wet Prep HPF POC: NONE SEEN

## 2020-08-12 LAB — HCG, QUANTITATIVE, PREGNANCY: hCG, Beta Chain, Quant, S: 895 m[IU]/mL — ABNORMAL HIGH (ref <5)

## 2020-08-12 NOTE — Discharge Instructions (Signed)
Return to care  If you have heavier bleeding that soaks through more than 2 pads per hour for an hour or more If you bleed so much that you feel like you might pass out or you do pass out If you have significant abdominal pain that is not improved with Tylenol   

## 2020-08-12 NOTE — MAU Provider Note (Signed)
History     CSN: [id number]  Arrival date and time: 08/11/20 2043   Event Date/Time   First Provider Initiated Contact with Patient 08/12/20 0045      Chief Complaint  Patient presents with   Abdominal Cramping   HPI Catherine Le is a 25 y.o. [id number] at [redacted]w[redacted]d who presents for abdominal cramping.  Reports intermittent lower abdominal cramping that feels like a period and thought to start.  Symptoms started on Saturday.  Rates pain 8/10.  Has not treated symptoms.  Denies vomiting, diarrhea, vaginal bleeding, fever, or vaginal discharge.  OB History     Gravida  5   Para  2   Term  2   Preterm      AB  2   Living  2      SAB  2   IAB      Ectopic      Multiple  0   Live Births  2           Past Medical History:  Diagnosis Date   Headache    Irregular bleeding 06/16/2015   [facility] 06/16/2015    Past Surgical History:  Procedure Laterality Date   TONSILLECTOMY      Family History  Problem Relation Age of Onset   Hypertension Father    Asthma Father    Hypertension Maternal Grandmother    Cancer Maternal Grandmother        lung   Hypertension Maternal Grandfather    Cancer Maternal Aunt        breast    Social History   Tobacco Use   Smoking status: Never   Smokeless tobacco: Never  Vaping Use   Vaping Use: Never used  Substance Use Topics   Alcohol use: No   Drug use: No    Allergies:  Allergies  Allergen Reactions   Shrimp [Shellfish Allergy] Shortness Of Breath    Medications Prior to Admission  Medication Sig Dispense Refill Last Dose   fluconazole (DIFLUCAN) 150 MG tablet Take by mouth.      ibuprofen (ADVIL) 600 MG tablet Take 1 tablet (600 mg total) by mouth every 6 (six) hours as needed for cramping. 30 tablet 0    megestrol (MEGACE) 40 MG tablet Take 3 x 5 days then 2 x 5 days then 1 daily 45 tablet 1    metroNIDAZOLE (FLAGYL) 500 MG tablet Take 1 tablet (500 mg total) by mouth 2 (two) times daily. 14 tablet 0     naproxen sodium (ANAPROX) 550 MG tablet Take 1 tablet (550 mg total) by mouth 2 (two) times daily as needed for mild pain or moderate pain. 30 tablet 0    sulfamethoxazole-trimethoprim (BACTRIM DS) 800-160 MG tablet Take 1 tablet by mouth 2 (two) times daily. X 7 days 14 tablet 0     Review of Systems  Constitutional: Negative.   Gastrointestinal:  Positive for abdominal pain.  Genitourinary: Negative.   Physical Exam   Blood pressure 122/67, pulse 72, temperature 98.9 F (37.2 C), temperature source Oral, resp. rate 16, height 4\' 11"  (1.499 m), weight 117.6 kg, last menstrual period 07/14/2020, SpO2 100 %, unknown if currently breastfeeding.  Physical Exam Vitals and nursing note reviewed.  Constitutional:      General: She is not in acute distress.    Appearance: Normal appearance.  HENT:     Head: Normocephalic and atraumatic.  Eyes:     General: No scleral icterus. Pulmonary:  Effort: Pulmonary effort is normal. No respiratory distress.  Abdominal:     General: There is no distension.     Palpations: Abdomen is soft.  Neurological:     Mental Status: She is alert.  Psychiatric:        Mood and Affect: Mood normal.        Behavior: Behavior normal.    MAU Course  Procedures Results for orders placed or performed during the hospital encounter of 08/11/20 (from the past 24 hour(s))  CBC with Differential     Status: Abnormal   Collection Time: 08/11/20  9:09 PM  Result Value Ref Range   WBC 13.6 (H) 4.0 - 10.5 K/uL   RBC 4.30 3.87 - 5.11 MIL/uL   Hemoglobin 12.2 12.0 - 15.0 g/dL   HCT 76.1 60.7 - 37.1 %   MCV 90.2 80.0 - 100.0 fL   MCH 28.4 26.0 - 34.0 pg   MCHC 31.4 30.0 - 36.0 g/dL   RDW 06.2 69.4 - 85.4 %   Platelets 441 (H) 150 - 400 K/uL   nRBC 0.0 0.0 - 0.2 %   Neutrophils Relative % 67 %   Neutro Abs 9.1 (H) 1.7 - 7.7 K/uL   Lymphocytes Relative 25 %   Lymphs Abs 3.3 0.7 - 4.0 K/uL   Monocytes Relative 7 %   Monocytes Absolute 1.0 0.1 - 1.0 K/uL    Eosinophils Relative 1 %   Eosinophils Absolute 0.1 0.0 - 0.5 K/uL   Basophils Relative 0 %   Basophils Absolute 0.1 0.0 - 0.1 K/uL   Immature Granulocytes 0 %   Abs Immature Granulocytes 0.04 0.00 - 0.07 K/uL  Comprehensive metabolic panel     Status: Abnormal   Collection Time: 08/11/20  9:09 PM  Result Value Ref Range   Sodium 135 135 - 145 mmol/L   Potassium 3.4 (L) 3.5 - 5.1 mmol/L   Chloride 103 98 - 111 mmol/L   CO2 23 22 - 32 mmol/L   Glucose, Bld 87 70 - 99 mg/dL   BUN 5 (L) 6 - 20 mg/dL   Creatinine, Ser 6.27 0.44 - 1.00 mg/dL   Calcium 9.1 8.9 - 03.5 mg/dL   Total Protein 7.7 6.5 - 8.1 g/dL   Albumin 3.8 3.5 - 5.0 g/dL   AST 18 15 - 41 U/L   ALT 19 0 - 44 U/L   Alkaline Phosphatase 59 38 - 126 U/L   Total Bilirubin 0.7 0.3 - 1.2 mg/dL   GFR, Estimated >00 >93 mL/min   Anion gap 9 5 - 15  Lipase, blood     Status: None   Collection Time: 08/11/20  9:09 PM  Result Value Ref Range   Lipase 22 11 - 51 U/L  Urinalysis, Routine w reflex microscopic Urine, Clean Catch     Status: Abnormal   Collection Time: 08/11/20  9:09 PM  Result Value Ref Range   Color, Urine YELLOW YELLOW   APPearance HAZY (A) CLEAR   Specific Gravity, Urine 1.025 1.005 - 1.030   pH 6.0 5.0 - 8.0   Glucose, UA NEGATIVE NEGATIVE mg/dL   Hgb urine dipstick NEGATIVE NEGATIVE   Bilirubin Urine NEGATIVE NEGATIVE   Ketones, ur NEGATIVE NEGATIVE mg/dL   Protein, ur NEGATIVE NEGATIVE mg/dL   Nitrite NEGATIVE NEGATIVE   Leukocytes,Ua NEGATIVE NEGATIVE  hCG, quantitative, pregnancy     Status: Abnormal   Collection Time: 08/11/20  9:09 PM  Result Value Ref Range   hCG, Beta Chain,  Quant, S 895 (H) <5 mIU/mL  I-Stat beta hCG blood, ED     Status: Abnormal   Collection Time: 08/11/20  9:47 PM  Result Value Ref Range   I-stat hCG, quantitative 798.1 (H) <5 mIU/mL   Comment 3          Wet prep, genital     Status: Abnormal   Collection Time: 08/12/20 12:40 AM   Specimen: PATH Cytology Cervicovaginal  Ancillary Only  Result Value Ref Range   Yeast Wet Prep HPF POC NONE SEEN NONE SEEN   Trich, Wet Prep NONE SEEN NONE SEEN   Clue Cells Wet Prep HPF POC NONE SEEN NONE SEEN   WBC, Wet Prep HPF POC MANY (A) NONE SEEN   Sperm NONE SEEN    US OB LESS THAN 14 WEEKS WITH OB TRANSVAGINAL  Result Date: 08/12/2020 CLINICAL DATA:  Cramps for a few days. EXAM: OBSTETRIC <14 WK [place] AND TRANSVAGINAL OB US TECHNIQUE: Both transabdominal and transvaginal ultrasound examinations were performed for complete evaluation of the gestation as well as the maternal uterus, adnexal regions, and pelvic cul-de-sac. Transvaginal technique was performed to assess early pregnancy. COMPARISON:  None. FINDINGS: Intrauterine gestational sac: None Yolk sac:  Not Visualized. Embryo:  Not Visualized. Cardiac Activity: Not Visualized. Heart Rate: N/A  bpm Subchorionic hemorrhage:  None visualized. Maternal uterus/adnexae: The endometrium measures 1.36 cm in thickness and is mildly heterogeneous in appearance. The right ovary measures 2.9 cm x 2.8 cm x 2.4 cm and is normal in appearance. The left ovary measures 3.5 cm x 2.3 cm x 2.7 cm and contains a small corpus luteum cyst. A trace amount of pelvic free fluid is seen. IMPRESSION: Thickened, mildly heterogeneous endometrium without evidence of an intrauterine pregnancy. Electronically Signed   By: Aram Candela M.D.   On: 08/12/2020 01:03    MDM +UPT UA, wet prep, GC/chlamydia, CBC, ABO/Rh, quant hCG, and [place] today to rule out ectopic pregnancy which can be life threatening.   Ultrasound shows no IUP or adnexal mass & HCG today is 895. Pregnancy is likely too early to see on ultrasound but can't exclude ectopic or miscarriage. Will get stat HCG on Friday.   U/a & wet prep negative.   Assessment and Plan   1. Pregnancy of unknown anatomic location   2. Abdominal pain during pregnancy in first trimester   3. [redacted] weeks gestation of pregnancy    -stat HCG on Friday - will message  [facility] to change her confirmation appt on Friday to a stat HCG -reviewed ectopic precautions & reasons to return to MAU  Judeth Horn 08/12/2020, 12:45 AM

## 2020-08-13 LAB — GC/CHLAMYDIA PROBE AMP (~~LOC~~) NOT AT ARMC
Chlamydia: NEGATIVE
Comment: NEGATIVE
Comment: NORMAL
Neisseria Gonorrhea: NEGATIVE

## 2020-08-14 ENCOUNTER — Other Ambulatory Visit: Payer: Medicaid Other

## 2020-08-14 DIAGNOSIS — O3680X0 Pregnancy with inconclusive fetal viability, not applicable or unspecified: Secondary | ICD-10-CM

## 2020-08-25 ENCOUNTER — Telehealth: Payer: Self-pay | Admitting: Obstetrics & Gynecology

## 2020-08-25 NOTE — Telephone Encounter (Signed)
Pt calling wanting to know she can get something for [place] is she [redacted] wks pregnant to be sent to [facility] on [address]

## 2020-08-26 ENCOUNTER — Telehealth: Payer: Self-pay | Admitting: *Deleted

## 2020-08-26 MED ORDER — PROMETHAZINE HCL 25 MG PO TABS: 25.0000 mg | ORAL_TABLET | Freq: Four times a day (QID) | ORAL | 1 refills | Status: DC | PRN

## 2020-08-26 NOTE — Addendum Note (Signed)
Addended by: Cyril Mourning A on: 08/26/2020 12:39 PM   Modules accepted: Orders

## 2020-08-26 NOTE — Telephone Encounter (Signed)
Pt requesting rx for nausea. Patient aware to check with pharmacy later today.

## 2020-08-26 NOTE — Telephone Encounter (Signed)
Will rx phenergan  

## 2020-09-13 ENCOUNTER — Other Ambulatory Visit: Payer: Self-pay

## 2020-09-13 ENCOUNTER — Inpatient Hospital Stay (HOSPITAL_COMMUNITY): Payer: Medicaid Other

## 2020-09-13 ENCOUNTER — Encounter (HOSPITAL_COMMUNITY): Payer: Self-pay | Admitting: Obstetrics and Gynecology

## 2020-09-13 ENCOUNTER — Other Ambulatory Visit: Payer: Self-pay | Admitting: Obstetrics and Gynecology

## 2020-09-13 ENCOUNTER — Inpatient Hospital Stay (HOSPITAL_COMMUNITY)
Admission: AD | Admit: 2020-09-13 | Discharge: 2020-09-13 | Disposition: A | Discharge status: Home / Self Care | Payer: Medicaid Other | Attending: Obstetrics and Gynecology | Admitting: Obstetrics and Gynecology

## 2020-09-13 DIAGNOSIS — Z3A08 8 weeks gestation of pregnancy: Secondary | ICD-10-CM | POA: Insufficient documentation

## 2020-09-13 DIAGNOSIS — O30041 Twin pregnancy, dichorionic/diamniotic, first trimester: Secondary | ICD-10-CM | POA: Insufficient documentation

## 2020-09-13 DIAGNOSIS — O468X1 Other antepartum hemorrhage, first trimester: Secondary | ICD-10-CM

## 2020-09-13 DIAGNOSIS — R109 Unspecified abdominal pain: Secondary | ICD-10-CM | POA: Diagnosis not present

## 2020-09-13 DIAGNOSIS — O26851 Spotting complicating pregnancy, first trimester: Secondary | ICD-10-CM | POA: Diagnosis not present

## 2020-09-13 DIAGNOSIS — O210 Mild hyperemesis gravidarum: Secondary | ICD-10-CM | POA: Insufficient documentation

## 2020-09-13 DIAGNOSIS — O0991 Supervision of high risk pregnancy, unspecified, first trimester: Secondary | ICD-10-CM | POA: Diagnosis not present

## 2020-09-13 DIAGNOSIS — O26899 Other specified pregnancy related conditions, unspecified trimester: Secondary | ICD-10-CM

## 2020-09-13 DIAGNOSIS — O200 Threatened abortion: Secondary | ICD-10-CM | POA: Insufficient documentation

## 2020-09-13 DIAGNOSIS — Z3490 Encounter for supervision of normal pregnancy, unspecified, unspecified trimester: Secondary | ICD-10-CM

## 2020-09-13 DIAGNOSIS — O418X10 Other specified disorders of amniotic fluid and membranes, first trimester, not applicable or unspecified: Secondary | ICD-10-CM

## 2020-09-13 LAB — WET PREP, GENITAL
Sperm: NONE SEEN
Trich, Wet Prep: NONE SEEN
Yeast Wet Prep HPF POC: NONE SEEN

## 2020-09-13 LAB — CBC
HCT: 34.9 % — ABNORMAL LOW (ref 36.0–46.0)
Hemoglobin: 11.4 g/dL — ABNORMAL LOW (ref 12.0–15.0)
MCH: 28.6 pg (ref 26.0–34.0)
MCHC: 32.7 g/dL (ref 30.0–36.0)
MCV: 87.7 fL (ref 80.0–100.0)
Platelets: 356 10*3/uL (ref 150–400)
RBC: 3.98 MIL/uL (ref 3.87–5.11)
RDW: 13.3 % (ref 11.5–15.5)
WBC: 15.2 10*3/uL — ABNORMAL HIGH (ref 4.0–10.5)
nRBC: 0 % (ref 0.0–0.2)

## 2020-09-13 LAB — URINALYSIS, ROUTINE W REFLEX MICROSCOPIC
Bacteria, UA: NONE SEEN
Bilirubin Urine: NEGATIVE
Glucose, UA: NEGATIVE mg/dL
Hgb urine dipstick: NEGATIVE
Ketones, ur: 5 mg/dL — AB
Leukocytes,Ua: NEGATIVE
Nitrite: NEGATIVE
Protein, ur: 30 mg/dL — AB
Specific Gravity, Urine: 1.03 (ref 1.005–1.030)
pH: 6 (ref 5.0–8.0)

## 2020-09-13 LAB — POCT PREGNANCY, URINE: Preg Test, Ur: POSITIVE — AB

## 2020-09-13 LAB — HCG, QUANTITATIVE, PREGNANCY: hCG, Beta Chain, Quant, S: 371980 m[IU]/mL — ABNORMAL HIGH (ref <5)

## 2020-09-13 LAB — HIV ANTIBODY (ROUTINE TESTING W REFLEX): HIV Screen 4th Generation wRfx: NONREACTIVE

## 2020-09-13 MED ORDER — METOCLOPRAMIDE HCL 10 MG PO TABS: 10.0000 mg | ORAL_TABLET | Freq: Four times a day (QID) | ORAL | 0 refills | Status: DC

## 2020-09-13 MED ORDER — ONDANSETRON 4 MG PO TBDP: 4.0000 mg | ORAL_TABLET | Freq: Three times a day (TID) | ORAL | 0 refills | Status: DC | PRN

## 2020-09-13 MED ORDER — ONDANSETRON 4 MG PO TBDP
8.0000 mg | ORAL_TABLET | Freq: Once | ORAL | Status: AC
  Administered 2020-09-13: 8 mg via ORAL
  Filled 2020-09-13: qty 2

## 2020-09-13 NOTE — MAU Provider Note (Signed)
History     CSN: [id number]  Arrival date and time: 09/13/20 1022   Event Date/Time   First Provider Initiated Contact with Patient 09/13/20 1127      Chief Complaint  Patient presents with   Vaginal Bleeding   [name] is a 25 y.o. year old [id number] female at [redacted]w[redacted]d weeks gestation who presents to MAU reporting vaginal spotting since last night at 11:30 PM.  She also reports intermittent lower abdominal cramping rating the pain 4 out of 10.  She also has a complaint of nausea and vomiting.  She states she cannot eat or drink anything without vomiting; the last time she tried to eat was last night but she started feeling nauseous and threw the food away.  Her last vomiting episode was at 5 AM this morning she reports it was only "stomach acid".  She has a prescription for Phenergan.  The last time she took Phenergan was Friday, September 11, 2020. She reports she takes Phenergan when she is not working, because it makes her sleepy.  She was advised to have a f/u U/S at [facility] last month, but did not. She states they told me the next available appt for U/S is on 10/06/20. Viability of her pregnancy has not been verified.   OB History     Gravida  5   Para  2   Term  2   Preterm      AB  2   Living  2      SAB  2   IAB      Ectopic      Multiple  0   Live Births  2           Past Medical History:  Diagnosis Date   Headache    Irregular bleeding 06/16/2015   [facility] 06/16/2015    Past Surgical History:  Procedure Laterality Date   TONSILLECTOMY      Family History  Problem Relation Age of Onset   Hypertension Father    Asthma Father    Hypertension Maternal Grandmother    Cancer Maternal Grandmother        lung   Hypertension Maternal Grandfather    Cancer Maternal Aunt        breast    Social History   Tobacco Use   Smoking status: Never   Smokeless tobacco: Never  Vaping Use   Vaping Use: Never used  Substance Use Topics    Alcohol use: No   Drug use: No    Allergies:  Allergies  Allergen Reactions   Shrimp [Shellfish Allergy] Shortness Of Breath    Medications Prior to Admission  Medication Sig Dispense Refill Last Dose   promethazine (PHENERGAN) 25 MG tablet Take 1 tablet (25 mg total) by mouth every 6 (six) hours as needed for nausea or vomiting. 30 tablet 1 Past Week    Review of Systems  Constitutional:  Positive for appetite change.  HENT: Negative.    Eyes: Negative.   Respiratory: Negative.    Cardiovascular: Negative.   Gastrointestinal:  Positive for nausea and vomiting.  Endocrine: Negative.   Genitourinary:  Positive for pelvic pain and vaginal bleeding (spotting since 2330 last night).  Musculoskeletal: Negative.   Skin: Negative.   Allergic/Immunologic: Negative.   Neurological: Negative.   Hematological: Negative.   Psychiatric/Behavioral: Negative.    Physical Exam   Blood pressure 126/74, pulse 95, temperature 99.2 F (37.3 C), temperature source Oral, resp. rate 17,  height 5\' 2"  (1.575 m), weight 113.6 kg, last menstrual period 07/14/2020, SpO2 100 %, unknown if currently breastfeeding.  Physical Exam Vitals and nursing note reviewed. Exam conducted with a chaperone present.  Constitutional:      Appearance: Normal appearance. She is obese.  Cardiovascular:     Rate and Rhythm: Normal rate.  Pulmonary:     Effort: Pulmonary effort is normal.  Abdominal:     Palpations: Abdomen is soft.  Genitourinary:    General: Normal vulva.     Comments: Pelvic exam: External genitalia normal, SE: vaginal walls pink and well rugated, cervix is smooth, pink, no lesions, small amt of thin, tan colored vaginal d/c -- WP, GC/CT done, cervix: closed/long/firm, Uterus is non-tender, (+) CMT, no friability, RT adnexal tenderness.  Musculoskeletal:        General: Normal range of motion.  Skin:    General: Skin is warm and dry.  Neurological:     Mental Status: She is alert and oriented  to person, place, and time.  Psychiatric:        Mood and Affect: Mood normal.        Behavior: Behavior normal.        Thought Content: Thought content normal.        Judgment: Judgment normal.    MAU Course  Procedures  MDM [facility] UPT CBC ABO/Rh HCG Wet Prep GC/CT -- pending HIV -- pending OB < 14 wks 09/14/2020 with TV Zofran 8 mg ODT -- given prior to d/c home  Results for orders placed or performed during the hospital encounter of 09/13/20 (from the past 24 hour(s))  Urinalysis, Routine w reflex microscopic Urine, Clean Catch     Status: Abnormal   Collection Time: 09/13/20 10:51 AM  Result Value Ref Range   Color, Urine YELLOW YELLOW   APPearance HAZY (A) CLEAR   Specific Gravity, Urine 1.030 1.005 - 1.030   pH 6.0 5.0 - 8.0   Glucose, UA NEGATIVE NEGATIVE mg/dL   Hgb urine dipstick NEGATIVE NEGATIVE   Bilirubin Urine NEGATIVE NEGATIVE   Ketones, ur 5 (A) NEGATIVE mg/dL   Protein, ur 30 (A) NEGATIVE mg/dL   Nitrite NEGATIVE NEGATIVE   Leukocytes,Ua NEGATIVE NEGATIVE   RBC / HPF 0-5 0 - 5 RBC/hpf   WBC, UA 0-5 0 - 5 WBC/hpf   Bacteria, UA NONE SEEN NONE SEEN   Squamous Epithelial / LPF 6-10 0 - 5   Mucus PRESENT   Pregnancy, urine POC     Status: Abnormal   Collection Time: 09/13/20 10:54 AM  Result Value Ref Range   Preg Test, Ur POSITIVE (A) NEGATIVE  Wet prep, genital     Status: Abnormal   Collection Time: 09/13/20 11:27 AM  Result Value Ref Range   Yeast Wet Prep HPF POC NONE SEEN NONE SEEN   Trich, Wet Prep NONE SEEN NONE SEEN   Clue Cells Wet Prep HPF POC PRESENT (A) NONE SEEN   WBC, Wet Prep HPF POC MANY (A) NONE SEEN   Sperm NONE SEEN   CBC     Status: Abnormal   Collection Time: 09/13/20 11:50 AM  Result Value Ref Range   WBC 15.2 (H) 4.0 - 10.5 K/uL   RBC 3.98 3.87 - 5.11 MIL/uL   Hemoglobin 11.4 (L) 12.0 - 15.0 g/dL   HCT 11/13/20 (L) 53.6 - 64.4 %   MCV 87.7 80.0 - 100.0 fL   MCH 28.6 26.0 - 34.0 pg   MCHC 32.7 30.0 -  36.0 g/dL   RDW 98.3 38.2 -  50.5 %   Platelets 356 150 - 400 K/uL   nRBC 0.0 0.0 - 0.2 %    US OB Comp Less 14 Wks  Result Date: 09/13/2020 CLINICAL DATA:  Cramping and spotting EXAM: TWIN OBSTETRICAL ULTRASOUND <14 WKS TECHNIQUE: Transabdominal ultrasound was performed for evaluation of the gestation as well as the maternal uterus and adnexal regions. COMPARISON:  None. FINDINGS: Number of IUPs:  2 Chorionicity/Amnionicity:  Dichorionic-diamniotic (thick membrane) TWIN 1 Yolk sac:  Visualized. Embryo:  Visualized. Cardiac Activity: Visualized. Heart Rate: 174 bpm CRL:   19.7 mm   8 w 3 d                  [place] EDC: 04/22/2021 TWIN 2 Yolk sac:  Visualized. Embryo:  Visualized. Cardiac Activity: Visualized. Heart Rate: 167 bpm CRL:   21.3 mm   8 w 5 d                  [place] EDC: 04/20/2021 Subchorionic hemorrhage:  Small Maternal uterus/adnexae: Unremarkable bilateral ovaries. No free fluid. IMPRESSION: Viable dichorionic-diamniotic twin intrauterine pregnancy. Estimated gestational age [redacted] weeks 3-5 days. Small subchorionic hemorrhage. Electronically Signed   By: Caprice Renshaw M.D.   On: 09/13/2020 13:05   US OB Comp AddL Gest Less 14 Wks  Result Date: 09/13/2020 CLINICAL DATA:  Cramping and spotting EXAM: TWIN OBSTETRICAL ULTRASOUND <14 WKS TECHNIQUE: Transabdominal ultrasound was performed for evaluation of the gestation as well as the maternal uterus and adnexal regions. COMPARISON:  None. FINDINGS: Number of IUPs:  2 Chorionicity/Amnionicity:  Dichorionic-diamniotic (thick membrane) TWIN 1 Yolk sac:  Visualized. Embryo:  Visualized. Cardiac Activity: Visualized. Heart Rate: 174 bpm CRL:   19.7 mm   8 w 3 d                  [place] EDC: 04/22/2021 TWIN 2 Yolk sac:  Visualized. Embryo:  Visualized. Cardiac Activity: Visualized. Heart Rate: 167 bpm CRL:   21.3 mm   8 w 5 d                  [place] EDC: 04/20/2021 Subchorionic hemorrhage:  Small Maternal uterus/adnexae: Unremarkable bilateral ovaries. No free fluid. IMPRESSION: Viable  dichorionic-diamniotic twin intrauterine pregnancy. Estimated gestational age [redacted] weeks 3-5 days. Small subchorionic hemorrhage. Electronically Signed   By: Caprice Renshaw M.D.   On: 09/13/2020 13:05     Assessment and Plan  Dichorionic diamniotic twin pregnancy in first trimester  - Advised of high risk pregnancy - Message sent to [facility] to get patient scheduled with HR provider and possibly seen sooner.  Abdominal cramping affecting pregnancy  - Information provided on abd pain in preg - Safe Meds in Preg List given   Subchorionic hemorrhage - Information provided on [facility] and threatened miscarriage   Morning sickness - Information provided on morning sickness - Rx for Reglan 10 mg every 6 hours prn N/V sent to pharmacy as requested by patient  Intrauterine pregnancy - Start PNC  [redacted] weeks gestation of pregnancy  - Discharge patient - Patient verbalized an understanding of the plan of care and agrees.    Raelyn Mora, CNM 09/13/2020, 11:27 AM

## 2020-09-13 NOTE — Discharge Instructions (Signed)

## 2020-09-13 NOTE — MAU Note (Signed)
..  Catherine Le is a 25 y.o. at [redacted]w[redacted]d here in MAU reporting: vaginal spotting that began last night around 2330 and began experiencing intermittent lower abdominal cramping that she rates 4/10. She is also endorsing continual nausea and vomiting and states she cannot eat anything without throwing it up. States she last attempted to eat last night and got nauseous so she threw it away. Last vomiting episode this morning at 0500 and states she threw up stomach acid. Patient is prescribed phenergan and her last dose was this past Friday night. She states she does not take it often as it makes her sleepy.   BP: 127/83 P: 92 T: 99.2 oral R: 17 O2: 100    Lab orders placed from triage: UA

## 2020-09-15 LAB — GC/CHLAMYDIA PROBE AMP (~~LOC~~) NOT AT ARMC
Chlamydia: NEGATIVE
Comment: NEGATIVE
Comment: NORMAL
Neisseria Gonorrhea: NEGATIVE

## 2020-09-16 ENCOUNTER — Telehealth: Payer: Self-pay | Admitting: *Deleted

## 2020-09-16 ENCOUNTER — Other Ambulatory Visit: Payer: Self-pay | Admitting: Advanced Practice Midwife

## 2020-09-16 MED ORDER — METRONIDAZOLE 500 MG PO TABS: 500.0000 mg | ORAL_TABLET | Freq: Two times a day (BID) | ORAL | 0 refills | Status: DC

## 2020-09-16 NOTE — Telephone Encounter (Signed)
Spoke to patient regarding message from MAU.  Informed I spoke with Dr Despina Hidden regarding her u/s and small [facility] noted.  At this time, she does not need sooner f/u as this is small and will be reassessed at next u/s on 9/27.   Patient states she is still having some light spotting. Clue cells noted on wet prep in MAU but not treated.  Patient would like treatment.  Will have provider send in medication to pharmacy.

## 2020-10-05 ENCOUNTER — Other Ambulatory Visit: Payer: Self-pay | Admitting: Obstetrics & Gynecology

## 2020-10-05 ENCOUNTER — Telehealth: Payer: Self-pay | Admitting: Women's Health

## 2020-10-05 DIAGNOSIS — Z3682 Encounter for antenatal screening for nuchal translucency: Secondary | ICD-10-CM

## 2020-10-05 DIAGNOSIS — O30041 Twin pregnancy, dichorionic/diamniotic, first trimester: Secondary | ICD-10-CM

## 2020-10-05 NOTE — Telephone Encounter (Signed)
Patient needs a note from office stating that you all want her to follow the discharge instructions given on page 13-14 of the discharge summary that the hospital gave her on 09/13/20.

## 2020-10-06 ENCOUNTER — Ambulatory Visit: Payer: Medicaid Other | Admitting: *Deleted

## 2020-10-06 ENCOUNTER — Other Ambulatory Visit (HOSPITAL_COMMUNITY)
Admission: RE | Admit: 2020-10-06 | Discharge: 2020-10-06 | Disposition: A | Discharge status: Home / Self Care | Payer: Medicaid Other | Source: Ambulatory Visit | Attending: Women's Health | Admitting: Women's Health

## 2020-10-06 ENCOUNTER — Ambulatory Visit (INDEPENDENT_AMBULATORY_CARE_PROVIDER_SITE_OTHER): Payer: Medicaid Other

## 2020-10-06 ENCOUNTER — Encounter: Payer: Self-pay | Admitting: Women's Health

## 2020-10-06 ENCOUNTER — Ambulatory Visit (INDEPENDENT_AMBULATORY_CARE_PROVIDER_SITE_OTHER): Payer: Medicaid Other | Admitting: Women's Health

## 2020-10-06 ENCOUNTER — Other Ambulatory Visit: Payer: Self-pay

## 2020-10-06 VITALS — BP 106/69 | HR 81 | Wt 247.0 lb

## 2020-10-06 DIAGNOSIS — O0991 Supervision of high risk pregnancy, unspecified, first trimester: Secondary | ICD-10-CM

## 2020-10-06 DIAGNOSIS — Z3682 Encounter for antenatal screening for nuchal translucency: Secondary | ICD-10-CM | POA: Diagnosis not present

## 2020-10-06 DIAGNOSIS — O30041 Twin pregnancy, dichorionic/diamniotic, first trimester: Secondary | ICD-10-CM | POA: Diagnosis not present

## 2020-10-06 DIAGNOSIS — Z124 Encounter for screening for malignant neoplasm of cervix: Secondary | ICD-10-CM

## 2020-10-06 DIAGNOSIS — O0990 Supervision of high risk pregnancy, unspecified, unspecified trimester: Secondary | ICD-10-CM | POA: Insufficient documentation

## 2020-10-06 DIAGNOSIS — O30049 Twin pregnancy, dichorionic/diamniotic, unspecified trimester: Secondary | ICD-10-CM | POA: Insufficient documentation

## 2020-10-06 DIAGNOSIS — Z113 Encounter for screening for infections with a predominantly sexual mode of transmission: Secondary | ICD-10-CM | POA: Insufficient documentation

## 2020-10-06 DIAGNOSIS — Z3A12 12 weeks gestation of pregnancy: Secondary | ICD-10-CM | POA: Diagnosis present

## 2020-10-06 DIAGNOSIS — Z3A11 11 weeks gestation of pregnancy: Secondary | ICD-10-CM

## 2020-10-06 MED ORDER — BLOOD PRESSURE MONITOR MISC: 0 refills | Status: DC

## 2020-10-06 MED ORDER — ASPIRIN 81 MG PO TBEC: 81.0000 mg | DELAYED_RELEASE_TABLET | Freq: Every day | ORAL | 3 refills | Status: DC

## 2020-10-06 MED ORDER — DOXYLAMINE-PYRIDOXINE 10-10 MG PO TBEC: DELAYED_RELEASE_TABLET | ORAL | 6 refills | Status: DC

## 2020-10-06 NOTE — Progress Notes (Signed)
INITIAL OBSTETRICAL VISIT Patient name: Catherine Le MRN [id number]  Date of birth: [birth year] Chief Complaint:   Initial Prenatal Visit  History of Present Illness:   Catherine Le is a 25 y.o. [phone] African-American female at [redacted]w[redacted]d by LMP c/w u/s at 8 weeks with an Estimated Date of Delivery: 04/23/21 being seen today for her initial obstetrical visit.   Patient's last menstrual period was 07/17/2020. Her obstetrical history is significant for  term uncomplicated svb x 2, SAB x 2 .   Today she reports  vaginal odor, taking flagyl for BV since Thursday . Wants a waterbirth- states these will be her last baby and her dream is to have a waterbirth- discussed unfortunately this is not an option w/ twin pregnancies.   Last pap never. Results were: N/A  Depression screen [facility] 2/9 10/06/2020  Decreased Interest 2  Down, Depressed, Hopeless 1  PHQ - 2 Score 3  Altered sleeping 3  Tired, decreased energy 3  Change in appetite 1  Feeling bad or failure about yourself  0  Trouble concentrating 0  Moving slowly or fidgety/restless 0  Suicidal thoughts 0  PHQ-9 Score 10     GAD 7 : Generalized Anxiety Score 10/06/2020  Nervous, Anxious, on Edge 1  Control/stop worrying 0  Worry too much - different things 0  Trouble relaxing 0  Restless 0  Easily annoyed or irritable 2  Afraid - awful might happen 0  Total GAD 7 Score 3     Review of Systems:   Pertinent items are noted in HPI Denies cramping/contractions, leakage of fluid, vaginal bleeding, abnormal vaginal discharge w/ itching/odor/irritation, headaches, visual changes, shortness of breath, chest pain, abdominal pain, severe nausea/vomiting, or problems with urination or bowel movements unless otherwise stated above.  Pertinent History Reviewed:  Reviewed past medical,surgical, social, obstetrical and family history.  Reviewed problem list, medications and allergies. OB History  Gravida Para Term Preterm AB Living  5  2 2   2 2   SAB IAB Ectopic Multiple Live Births  2     0 2    # Outcome Date GA Lbr Len/2nd Weight Sex Delivery Anes PTL Lv  5 Current           4 SAB 06/2019          3 SAB 2018          2 Term 03/12/15 [redacted]w[redacted]d 29:30 / 00:05 7 lb 0.5 oz (3.189 kg) F Vag-Spont None  LIV  1 Term 01/03/14 [redacted]w[redacted]d / 02:48 7 lb 1 oz (3.204 kg) F Vag-Spont None N LIV   Physical Assessment:   Vitals:   10/06/20 1350  BP: 106/69  Pulse: 81  Weight: 247 lb (112 kg)  Body mass index is 45.18 kg/m.       Physical Examination:  General appearance - well appearing, and in no distress  Mental status - alert, oriented to person, place, and time  Psych:  She has a normal mood and affect  Skin - warm and dry, normal color, no suspicious lesions noted  Chest - effort normal, all lung fields clear to auscultation bilaterally  Heart - normal rate and regular rhythm  Abdomen - soft, nontender  Extremities:  No swelling or varicosities noted  Pelvic - VULVA: normal appearing vulva with no masses, tenderness or lesions  VAGINA: normal appearing vagina with normal color and discharge, no lesions  CERVIX: normal appearing cervix without discharge or lesions, no CMT  Thin  prep pap is done w/ HR HPV cotesting  Chaperone: Faith Rogue    TODAY'S NT [place] 11+4 wks,DC/DA twins,normal ovaries [name]:anterior placenta,CRL 64.56 mm,FHR 150 bpm,? Short NB ,thickened NT 3.04 mm [name]: anterior placenta,CRL 64.30 mm,FHR 159 bpm,NB present,NT 1.7 mm  No results found for this or any previous visit (from the past 24 hour(s)).  Assessment & Plan:  1) High-Risk Pregnancy [id number] at [redacted]w[redacted]d with an Estimated Date of Delivery: 04/23/21   2) Initial OB visit  3) DC/DA twins> start ASA  4) Twin A w/ thickened NT and ?short nasal bone> discussed w/ L[facility], will wait on Panorama results. Notified pt  Meds:  Meds ordered this encounter  Medications   Blood Pressure Monitor MISC    Sig: For regular home bp monitoring during pregnancy     Dispense:  1 each    Refill:  0    O09.91 Please mail to patient   aspirin 81 MG EC tablet    Sig: Take 1 tablet (81 mg total) by mouth daily. Swallow whole.    Dispense:  90 tablet    Refill:  3    Order Specific Question:   Supervising Provider    Answer:   Duane Lope H [[id number]]   Doxylamine-Pyridoxine (DICLEGIS) 10-10 MG TBEC    Sig: 2 tabs q hs, if sx persist add 1 tab q am on day 3, if sx persist add 1 tab q afternoon on day 4    Dispense:  100 tablet    Refill:  6    Order Specific Question:   Supervising Provider    Answer:   Duane Lope H [[id number]]    Initial labs obtained Continue prenatal vitamins Reviewed n/v relief measures and warning s/s to report Reviewed recommended weight gain based on pre-gravid BMI Encouraged well-balanced diet Genetic & carrier screening discussed: requests Panorama, NT/IT, and Horizon  Ultrasound discussed; fetal survey: requested CCNC completed> form faxed if has or is planning to apply for medicaid The nature of [employer] for [employer] with multiple MDs and other Advanced Practice Providers was explained to patient; also emphasized that fellows, residents, and students are part of our team.  Follow-up: Return in about 4 weeks (around 11/03/2020) for HROB, 2nd IT, MD, in person.   Orders Placed This Encounter  Procedures   Urine Culture   Integrated 1   CBC/D/Plt+RPR+Rh+ABO+RubIgG...   Genetic Screening   Pain Management Screening Profile (10S)   POC Urinalysis Dipstick OB    Cheral Marker CNM, [facility] 10/06/2020 4:17 PM

## 2020-10-06 NOTE — Progress Notes (Addendum)
[place] 11+4 wks,DC/DA twins,normal ovaries [name]:anterior placenta,CRL 64.56 mm,FHR 150 bpm,? Short NB,thickened NT 3.04 mm [name]: anterior placenta,CRL 64.30 mm,FHR 159 bpm,NB present,NT 1.7 mm

## 2020-10-06 NOTE — Patient Instructions (Signed)
Catherine Le, thank you for choosing our office today! We appreciate the opportunity to meet your healthcare needs. You may receive a short survey by mail, e-mail, or through [employer]. If you are happy with your care we would appreciate if you could take just a few minutes to complete the survey questions. We read all of your comments and take your feedback very seriously. Thank you again for choosing our office.  Center for [employer] Healthcare Team at [facility]  [facility] at [facility] ([address] [place], [place] [address]) Entrance C, located off of [address] Free 24/7 valet parking   Nausea & Vomiting Have saltine crackers or pretzels by your bed and eat a few bites before you raise your head out of bed in the morning Eat small frequent meals throughout the day instead of large meals Drink plenty of fluids throughout the day to stay hydrated, just don't drink a lot of fluids with your meals.  This can make your stomach fill up faster making you feel sick Do not brush your teeth right after you eat Products with real ginger are good for nausea, like ginger ale and ginger hard candy Make sure it says made with real ginger! Sucking on sour candy like lemon heads is also good for nausea If your prenatal vitamins make you nauseated, take them at night so you will sleep through the nausea Sea Bands If you feel like you need medicine for the nausea & vomiting please let us know If you are unable to keep any fluids or food down please let us know   Constipation Drink plenty of fluid, preferably water, throughout the day Eat foods high in fiber such as fruits, vegetables, and grains Exercise, such as walking, is a good way to keep your bowels regular Drink warm fluids, especially warm prune juice, or decaf coffee Eat a 1/2 cup of real oatmeal (not instant), 1/2 cup applesauce, and 1/2-1 cup warm prune juice every day If needed, you may take Colace (docusate sodium) stool  softener once or twice a day to help keep the stool soft.  If you still are having problems with constipation, you may take Miralax once daily as needed to help keep your bowels regular.   Home Blood Pressure Monitoring for Patients   Your provider has recommended that you check your blood pressure (BP) at least once a week at home. If you do not have a blood pressure cuff at home, one will be provided for you. Contact your provider if you have not received your monitor within 1 week.   Helpful Tips for Accurate Home Blood Pressure Checks  Don't smoke, exercise, or drink caffeine 30 minutes before checking your BP Use the restroom before checking your BP (a full bladder can raise your pressure) Relax in a comfortable upright chair Feet on the ground Left arm resting comfortably on a flat surface at the level of your heart Legs uncrossed Back supported Sit quietly and don't talk Place the cuff on your bare arm Adjust snuggly, so that only two fingertips can fit between your skin and the top of the cuff Check 2 readings separated by at least one minute Keep a log of your BP readings For a visual, please reference this diagram: http://ccnc.care/bpdiagram  Provider Name: [facility]     Phone: [phone]  Zone 1: ALL CLEAR  Continue to monitor your symptoms:  BP reading is less than 140 (top number) or less than 90 (bottom  number)  No right upper stomach pain No headaches or seeing spots No feeling nauseated or throwing up No swelling in face and hands  Zone 2: CAUTION Call your doctor's office for any of the following:  BP reading is greater than 140 (top number) or greater than 90 (bottom number)  Stomach pain under your ribs in the middle or right side Headaches or seeing spots Feeling nauseated or throwing up Swelling in face and hands  Zone 3: EMERGENCY  Seek immediate medical care if you have any of the following:  BP reading is greater than160 (top number) or  greater than 110 (bottom number) Severe headaches not improving with Tylenol Serious difficulty catching your breath Any worsening symptoms from Zone 2    First Trimester of Pregnancy The first trimester of pregnancy is from week 1 until the end of week 12 (months 1 through 3). A week after a sperm fertilizes an egg, the egg will implant on the wall of the uterus. This embryo will begin to develop into a baby. Genes from you and your partner are forming the baby. The female genes determine whether the baby is a boy or a girl. At 6-8 weeks, the eyes and face are formed, and the heartbeat can be seen on ultrasound. At the end of 12 weeks, all the baby's organs are formed.  Now that you are pregnant, you will want to do everything you can to have a healthy baby. Two of the most important things are to get good prenatal care and to follow your health care provider's instructions. Prenatal care is all the medical care you receive before the baby's birth. This care will help prevent, find, and treat any problems during the pregnancy and childbirth. BODY CHANGES Your body goes through many changes during pregnancy. The changes vary from woman to woman.  You may gain or lose a couple of pounds at first. You may feel sick to your stomach (nauseous) and throw up (vomit). If the vomiting is uncontrollable, call your health care provider. You may tire easily. You may develop headaches that can be relieved by medicines approved by your health care provider. You may urinate more often. Painful urination may mean you have a bladder infection. You may develop heartburn as a result of your pregnancy. You may develop constipation because certain hormones are causing the muscles that push waste through your intestines to slow down. You may develop hemorrhoids or swollen, bulging veins (varicose veins). Your breasts may begin to grow larger and become tender. Your nipples may stick out more, and the tissue that  surrounds them (areola) may become darker. Your gums may bleed and may be sensitive to brushing and flossing. Dark spots or blotches (chloasma, mask of pregnancy) may develop on your face. This will likely fade after the baby is born. Your menstrual periods will stop. You may have a loss of appetite. You may develop cravings for certain kinds of food. You may have changes in your emotions from day to day, such as being excited to be pregnant or being concerned that something may go wrong with the pregnancy and baby. You may have more vivid and strange dreams. You may have changes in your hair. These can include thickening of your hair, rapid growth, and changes in texture. Some women also have hair loss during or after pregnancy, or hair that feels dry or thin. Your hair will most likely return to normal after your baby is born. WHAT TO EXPECT AT YOUR PRENATAL  VISITS During a routine prenatal visit: You will be weighed to make sure you and the baby are growing normally. Your blood pressure will be taken. Your abdomen will be measured to track your baby's growth. The fetal heartbeat will be listened to starting around week 10 or 12 of your pregnancy. Test results from any previous visits will be discussed. Your health care provider may ask you: How you are feeling. If you are feeling the baby move. If you have had any abnormal symptoms, such as leaking fluid, bleeding, severe headaches, or abdominal cramping. If you have any questions. Other tests that may be performed during your first trimester include: Blood tests to find your blood type and to check for the presence of any previous infections. They will also be used to check for low iron levels (anemia) and Rh antibodies. Later in the pregnancy, blood tests for diabetes will be done along with other tests if problems develop. Urine tests to check for infections, diabetes, or protein in the urine. An ultrasound to confirm the proper growth  and development of the baby. An amniocentesis to check for possible genetic problems. Fetal screens for spina bifida and Down syndrome. You may need other tests to make sure you and the baby are doing well. HOME CARE INSTRUCTIONS  Medicines Follow your health care provider's instructions regarding medicine use. Specific medicines may be either safe or unsafe to take during pregnancy. Take your prenatal vitamins as directed. If you develop constipation, try taking a stool softener if your health care provider approves. Diet Eat regular, well-balanced meals. Choose a variety of foods, such as meat or vegetable-based protein, fish, milk and low-fat dairy products, vegetables, fruits, and whole grain breads and cereals. Your health care provider will help you determine the amount of weight gain that is right for you. Avoid raw meat and uncooked cheese. These carry germs that can cause birth defects in the baby. Eating four or five small meals rather than three large meals a day may help relieve nausea and vomiting. If you start to feel nauseous, eating a few soda crackers can be helpful. Drinking liquids between meals instead of during meals also seems to help nausea and vomiting. If you develop constipation, eat more high-fiber foods, such as fresh vegetables or fruit and whole grains. Drink enough fluids to keep your urine clear or pale yellow. Activity and Exercise Exercise only as directed by your health care provider. Exercising will help you: Control your weight. Stay in shape. Be prepared for labor and delivery. Experiencing pain or cramping in the lower abdomen or low back is a good sign that you should stop exercising. Check with your health care provider before continuing normal exercises. Try to avoid standing for long periods of time. Move your legs often if you must stand in one place for a long time. Avoid heavy lifting. Wear low-heeled shoes, and practice good posture. You may  continue to have sex unless your health care provider directs you otherwise. Relief of Pain or Discomfort Wear a good support bra for breast tenderness.   Take warm sitz baths to soothe any pain or discomfort caused by hemorrhoids. Use hemorrhoid cream if your health care provider approves.   Rest with your legs elevated if you have leg cramps or low back pain. If you develop varicose veins in your legs, wear support hose. Elevate your feet for 15 minutes, 3-4 times a day. Limit salt in your diet. Prenatal Care Schedule your prenatal visits by the  twelfth week of pregnancy. They are usually scheduled monthly at first, then more often in the last 2 months before delivery. Write down your questions. Take them to your prenatal visits. Keep all your prenatal visits as directed by your health care provider. Safety Wear your seat belt at all times when driving. Make a list of emergency phone numbers, including numbers for family, friends, the hospital, and police and fire departments. General Tips Ask your health care provider for a referral to a local prenatal education class. Begin classes no later than at the beginning of month 6 of your pregnancy. Ask for help if you have counseling or nutritional needs during pregnancy. Your health care provider can offer advice or refer you to specialists for help with various needs. Do not use hot tubs, steam rooms, or saunas. Do not douche or use tampons or scented sanitary pads. Do not cross your legs for long periods of time. Avoid cat litter boxes and soil used by cats. These carry germs that can cause birth defects in the baby and possibly loss of the fetus by miscarriage or stillbirth. Avoid all smoking, herbs, alcohol, and medicines not prescribed by your health care provider. Chemicals in these affect the formation and growth of the baby. Schedule a dentist appointment. At home, brush your teeth with a soft toothbrush and be gentle when you floss. SEEK  MEDICAL CARE IF:  You have dizziness. You have mild pelvic cramps, pelvic pressure, or nagging pain in the abdominal area. You have persistent nausea, vomiting, or diarrhea. You have a bad smelling vaginal discharge. You have pain with urination. You notice increased swelling in your face, hands, legs, or ankles. SEEK IMMEDIATE MEDICAL CARE IF:  You have a fever. You are leaking fluid from your vagina. You have spotting or bleeding from your vagina. You have severe abdominal cramping or pain. You have rapid weight gain or loss. You vomit blood or material that looks like coffee grounds. You are exposed to [place] measles and have never had them. You are exposed to fifth disease or chickenpox. You develop a severe headache. You have shortness of breath. You have any kind of trauma, such as from a fall or a car accident. Document Released: 12/21/2000 Document Revised: 05/13/2013 Document Reviewed: 11/06/2012 [facility] Patient Information 2015 [place], [place]. This information is not intended to replace advice given to you by your health care provider. Make sure you discuss any questions you have with your health care provider.

## 2020-10-09 LAB — CYTOLOGY - PAP
Chlamydia: NEGATIVE
Comment: NEGATIVE
Comment: NEGATIVE
Comment: NORMAL
Diagnosis: NEGATIVE
High risk HPV: NEGATIVE
Neisseria Gonorrhea: NEGATIVE

## 2020-10-13 LAB — INTEGRATED 1
Crown Rump Length Twin B: 64 mm
Crown Rump Length: 64.6 mm
Gest. Age on Collection Date: 12.6 weeks
Maternal Age at EDD: 26.1 yr
NT Twin B: 1.7 mm
Nuchal Translucency (NT): 3 mm
Number of Fetuses: 2
PAPP-A Value: 2049 ng/mL
Weight: 247 [lb_av]

## 2020-10-13 LAB — CBC/D/PLT+RPR+RH+ABO+RUBIGG...
Antibody Screen: NEGATIVE
Basophils Absolute: 0.1 10*3/uL (ref 0.0–0.2)
Basos: 0 %
EOS (ABSOLUTE): 0.1 10*3/uL (ref 0.0–0.4)
Eos: 1 %
HCV Ab: <0.1 s/co ratio (ref 0.0–0.9)
HIV Screen 4th Generation wRfx: NONREACTIVE
Hematocrit: 35.3 % (ref 34.0–46.6)
Hemoglobin: 11.8 g/dL (ref 11.1–15.9)
Hepatitis B Surface Ag: NEGATIVE
Immature Grans (Abs): 0 10*3/uL (ref 0.0–0.1)
Immature Granulocytes: 0 %
Lymphocytes Absolute: 2.5 10*3/uL (ref 0.7–3.1)
Lymphs: 19 %
MCH: 28.9 pg (ref 26.6–33.0)
MCHC: 33.4 g/dL (ref 31.5–35.7)
MCV: 87 fL (ref 79–97)
Monocytes Absolute: 1.1 10*3/uL — ABNORMAL HIGH (ref 0.1–0.9)
Monocytes: 8 %
Neutrophils Absolute: 9.5 10*3/uL — ABNORMAL HIGH (ref 1.4–7.0)
Neutrophils: 72 %
Platelets: 403 10*3/uL (ref 150–450)
RBC: 4.08 x10E6/uL (ref 3.77–5.28)
RDW: 13.2 % (ref 11.7–15.4)
RPR Ser Ql: NONREACTIVE
Rh Factor: POSITIVE
Rubella Antibodies, IGG: 2.02 index (ref >0.99)
WBC: 13.3 10*3/uL — ABNORMAL HIGH (ref 3.4–10.8)

## 2020-10-13 LAB — HCV INTERPRETATION

## 2020-10-19 ENCOUNTER — Encounter: Payer: Self-pay | Admitting: Women's Health

## 2020-11-02 ENCOUNTER — Other Ambulatory Visit: Payer: Medicaid Other

## 2020-11-05 ENCOUNTER — Encounter: Payer: Medicaid Other | Admitting: Obstetrics & Gynecology

## 2020-11-12 ENCOUNTER — Encounter: Payer: Medicaid Other | Admitting: Obstetrics & Gynecology

## 2020-11-19 ENCOUNTER — Telehealth: Payer: Self-pay | Admitting: Obstetrics & Gynecology

## 2020-11-19 ENCOUNTER — Encounter: Payer: Medicaid Other | Admitting: Obstetrics & Gynecology

## 2020-11-19 NOTE — Telephone Encounter (Signed)
Pt called, couldn't make her appointment today  No avail appt to reschedule this week or next  Pt states she's having sharp left side pelvic pain, can't get comfortable  States she went to be seen & was told not to come in to the hospital due to high flu cases in [place]  Please advise & notify pt

## 2020-11-19 NOTE — Telephone Encounter (Signed)
Patient states she went to [facility] and was advised not to be seen due to high cases of the flu.  States she is having " a lot of vaginal pressure" and discharge. She is unable to get comfortable.  Advised patient to go to [facility] to be evaluated. Pt verbalized understanding and agreeable to plan.

## 2020-12-10 ENCOUNTER — Encounter: Payer: Medicaid Other | Admitting: Obstetrics and Gynecology

## 2020-12-11 ENCOUNTER — Other Ambulatory Visit: Payer: Self-pay | Admitting: Women's Health

## 2020-12-11 DIAGNOSIS — O30042 Twin pregnancy, dichorionic/diamniotic, second trimester: Secondary | ICD-10-CM

## 2020-12-11 DIAGNOSIS — Z363 Encounter for antenatal screening for malformations: Secondary | ICD-10-CM

## 2020-12-14 ENCOUNTER — Ambulatory Visit (INDEPENDENT_AMBULATORY_CARE_PROVIDER_SITE_OTHER): Payer: Medicaid Other

## 2020-12-14 ENCOUNTER — Encounter: Payer: Self-pay | Admitting: Obstetrics & Gynecology

## 2020-12-14 ENCOUNTER — Ambulatory Visit (INDEPENDENT_AMBULATORY_CARE_PROVIDER_SITE_OTHER): Payer: Medicaid Other | Admitting: Obstetrics & Gynecology

## 2020-12-14 ENCOUNTER — Other Ambulatory Visit: Payer: Self-pay

## 2020-12-14 VITALS — BP 108/77 | HR 85 | Wt 253.0 lb

## 2020-12-14 DIAGNOSIS — O0992 Supervision of high risk pregnancy, unspecified, second trimester: Secondary | ICD-10-CM

## 2020-12-14 DIAGNOSIS — O30042 Twin pregnancy, dichorionic/diamniotic, second trimester: Secondary | ICD-10-CM | POA: Diagnosis not present

## 2020-12-14 DIAGNOSIS — Z1379 Encounter for other screening for genetic and chromosomal anomalies: Secondary | ICD-10-CM

## 2020-12-14 DIAGNOSIS — Z3A21 21 weeks gestation of pregnancy: Secondary | ICD-10-CM

## 2020-12-14 DIAGNOSIS — Z363 Encounter for antenatal screening for malformations: Secondary | ICD-10-CM | POA: Diagnosis not present

## 2020-12-14 LAB — POCT URINALYSIS DIPSTICK OB
Blood, UA: NEGATIVE
Glucose, UA: NEGATIVE
Ketones, UA: NEGATIVE
Leukocytes, UA: NEGATIVE
Nitrite, UA: NEGATIVE
POC,PROTEIN,UA: NEGATIVE

## 2020-12-14 NOTE — Progress Notes (Signed)
HIGH-RISK PREGNANCY VISIT Patient name: Catherine Le MRN [id number]  Date of birth: 09/23/1995 Chief Complaint:   Routine Prenatal Visit, High Risk Gestation, and Pregnancy Ultrasound  History of Present Illness:   Catherine Le is a 25 y.o. [phone] female at [redacted]w[redacted]d with an Estimated Date of Delivery: 04/23/21 being seen today for ongoing management of a high-risk pregnancy complicated by DCDA twin gestation.    Today she reports no complaints. Contractions: Not present. Vag. Bleeding: None.  Movement: Present. denies leaking of fluid.   Depression screen [facility] 2/9 10/06/2020  Decreased Interest 2  Down, Depressed, Hopeless 1  PHQ - 2 Score 3  Altered sleeping 3  Tired, decreased energy 3  Change in appetite 1  Feeling bad or failure about yourself  0  Trouble concentrating 0  Moving slowly or fidgety/restless 0  Suicidal thoughts 0  PHQ-9 Score 10     GAD 7 : Generalized Anxiety Score 10/06/2020  Nervous, Anxious, on Edge 1  Control/stop worrying 0  Worry too much - different things 0  Trouble relaxing 0  Restless 0  Easily annoyed or irritable 2  Afraid - awful might happen 0  Total GAD 7 Score 3     Review of Systems:   Pertinent items are noted in HPI Denies abnormal vaginal discharge w/ itching/odor/irritation, headaches, visual changes, shortness of breath, chest pain, abdominal pain, severe nausea/vomiting, or problems with urination or bowel movements unless otherwise stated above. Pertinent History Reviewed:  Reviewed past medical,surgical, social, obstetrical and family history.  Reviewed problem list, medications and allergies. Physical Assessment:   Vitals:   12/14/20 1628  BP: 108/77  Pulse: 85  Weight: 253 lb (114.8 kg)  Body mass index is 46.27 kg/m.           Physical Examination:   General appearance: alert, well appearing, and in no distress  Mental status: alert, oriented to person, place, and time  Skin: warm & dry   Extremities: Edema:  Trace    Cardiovascular: normal heart rate noted  Respiratory: normal respiratory effort, no distress  Abdomen: gravid, soft, non-tender  Pelvic: Cervical exam deferred         Fetal Status:     Movement: Present    Fetal Surveillance Testing today: sonogram normal for A, B suboptimal  but with 2 RV EICF   Chaperone: N/A    Results for orders placed or performed in visit on 12/14/20 (from the past 24 hour(s))  POC Urinalysis Dipstick OB   Collection Time: 12/14/20  4:54 PM  Result Value Ref Range   Color, UA     Clarity, UA     Glucose, UA Negative Negative   Bilirubin, UA     Ketones, UA neg    Spec Grav, UA     Blood, UA neg    pH, UA     POC,PROTEIN,UA Negative Negative, Trace, Small (1+), Moderate (2+), Large (3+), 4+   Urobilinogen, UA     Nitrite, UA neg    Leukocytes, UA Negative Negative   Appearance     Odor      Assessment & Plan:  High-risk pregnancy: [id number] at [redacted]w[redacted]d with an Estimated Date of Delivery: 04/23/21   1) DCDA twins, stable, NT today    Meds: No orders of the defined types were placed in this encounter.   Labs/procedures today: U/S  Treatment Plan:  repeat scan 4 weeks  Reviewed: Preterm labor symptoms and general obstetric precautions including but not  limited to vaginal bleeding, contractions, leaking of fluid and fetal movement were reviewed in detail with the patient.  All questions were answered. Does have home bp cuff. Office bp cuff given: not applicable. Check bp daily, let us know if consistently >150 and/or >95.  Follow-up: Return in about 4 weeks (around 01/11/2021) for repeat anatomy scan for B.   No future appointments.  Orders Placed This Encounter  Procedures   Urine Culture   INTEGRATED 2   Pain Management Screening Profile (10S)   POC Urinalysis Dipstick OB   Amaryllis Dyke Livia Tarr  12/14/2020 5:03 PM

## 2020-12-16 ENCOUNTER — Telehealth: Payer: Self-pay | Admitting: Obstetrics & Gynecology

## 2020-12-16 NOTE — Telephone Encounter (Signed)
Returned pt's call for clarification, no answer, left vm

## 2020-12-16 NOTE — Telephone Encounter (Signed)
Spoke with pt. Two identifiers used. Pt stated on her last visit on 12/14/20 that Dr Despina Hidden wants her out of work when she reached [redacted] weeks gestation, which will be 01/29/21. She needs a letter via Mychart for her job to qualify for [employer]. She needs the letter to state that from now until 28 weeks, she has no work restrictions and that she will be required to stop working on 01/29/21. Pt was informed that Dr Despina Hidden was not in the office today, but would be sent the msg and it would be taken care of for her. Pt confirmed understanding.

## 2020-12-16 NOTE — Telephone Encounter (Signed)
Patient's work needs documentation on when and why she is being taken out of work early. She says you told her 28 weeks. Her job also needs a note stating that she has no restrictions as of right now.

## 2020-12-17 LAB — INTEGRATED 2
AFP MoM: 1.93
Alpha-Fetoprotein: 107.7 ng/mL
Crown Rump Length Twin B: 64 mm
Crown Rump Length: 64.6 mm
DIA Value: 651.3 pg/mL
Estriol, Unconjugated: 4.16 ng/mL
Gest. Age on Collection Date: 12.6 weeks
Gestational Age: 22.4 weeks
Maternal Age at EDD: 26.1 yr
NT MoM Twin B: 1.19
NT Twin B: 1.7 mm
Nuchal Translucency (NT): 3 mm
Nuchal Translucency MoM: 2.07
Number of Fetuses: 2
PAPP-A MoM: 3.87
PAPP-A Value: 2049 ng/mL
Weight: 247 [lb_av]
Weight: 247 [lb_av]
hCG Value: 21.4 IU/mL

## 2020-12-18 LAB — URINE CULTURE

## 2020-12-18 LAB — PMP SCREEN PROFILE (10S), URINE
Amphetamine Scrn, Ur: NEGATIVE ng/mL
BARBITURATE SCREEN URINE: NEGATIVE ng/mL
BENZODIAZEPINE SCREEN, URINE: NEGATIVE ng/mL
CANNABINOIDS UR QL SCN: NEGATIVE ng/mL
Cocaine (Metab) Scrn, Ur: NEGATIVE ng/mL
Creatinine(Crt), U: 119.3 mg/dL (ref 20.0–300.0)
Methadone Screen, Urine: NEGATIVE ng/mL
OXYCODONE+OXYMORPHONE UR QL SCN: NEGATIVE ng/mL
Opiate Scrn, Ur: NEGATIVE ng/mL
Ph of Urine: 6.8 (ref 4.5–8.9)
Phencyclidine Qn, Ur: NEGATIVE ng/mL
Propoxyphene Scrn, Ur: NEGATIVE ng/mL

## 2020-12-18 NOTE — Telephone Encounter (Signed)
Called pt to inform her of letter sent via Mychart. Pt needed the letter to include the 20 lb lifting restriction, so it was edited to reflect that. Pt also needed the Integrated 2 test result to be reviewed with her. Told pt a provider would go over that and call or msg her. Pt confirmed understanding.

## 2021-01-09 ENCOUNTER — Other Ambulatory Visit: Payer: Self-pay

## 2021-01-09 ENCOUNTER — Inpatient Hospital Stay (HOSPITAL_BASED_OUTPATIENT_CLINIC_OR_DEPARTMENT_OTHER): Payer: Medicaid Other

## 2021-01-09 ENCOUNTER — Inpatient Hospital Stay (HOSPITAL_COMMUNITY)
Admission: AD | Admit: 2021-01-09 | Discharge: 2021-01-09 | Disposition: A | Discharge status: Home / Self Care | Payer: Medicaid Other | Attending: Obstetrics & Gynecology | Admitting: Obstetrics & Gynecology

## 2021-01-09 ENCOUNTER — Encounter (HOSPITAL_COMMUNITY): Payer: Self-pay | Admitting: Obstetrics & Gynecology

## 2021-01-09 DIAGNOSIS — O9A212 Injury, poisoning and certain other consequences of external causes complicating pregnancy, second trimester: Secondary | ICD-10-CM | POA: Diagnosis not present

## 2021-01-09 DIAGNOSIS — O30042 Twin pregnancy, dichorionic/diamniotic, second trimester: Secondary | ICD-10-CM

## 2021-01-09 DIAGNOSIS — O0992 Supervision of high risk pregnancy, unspecified, second trimester: Secondary | ICD-10-CM | POA: Diagnosis not present

## 2021-01-09 DIAGNOSIS — W010XXA Fall on same level from slipping, tripping and stumbling without subsequent striking against object, initial encounter: Secondary | ICD-10-CM

## 2021-01-09 DIAGNOSIS — Z3A25 25 weeks gestation of pregnancy: Secondary | ICD-10-CM

## 2021-01-09 DIAGNOSIS — O26892 Other specified pregnancy related conditions, second trimester: Secondary | ICD-10-CM | POA: Insufficient documentation

## 2021-01-09 DIAGNOSIS — Z3689 Encounter for other specified antenatal screening: Secondary | ICD-10-CM

## 2021-01-09 DIAGNOSIS — Y9301 Activity, walking, marching and hiking: Secondary | ICD-10-CM | POA: Diagnosis not present

## 2021-01-09 DIAGNOSIS — R109 Unspecified abdominal pain: Secondary | ICD-10-CM | POA: Diagnosis not present

## 2021-01-09 NOTE — MAU Note (Signed)
Patient endorses an increase in fetal movement from baby a and baby b. She states they are active now more than she has ever felt.

## 2021-01-09 NOTE — MAU Note (Signed)
Patient endorses vigorous fetal movement with both baby a and baby b.

## 2021-01-09 NOTE — MAU Note (Signed)
...  Catherine Le is a 25 y.o. at 107w1d with DC/DA twins here in MAU reporting: Larey Seat at work around 1400. +FM with [name] and no FM with movement with [name]. Endorses lower abdominal pain since her fall. No VB or LOF.

## 2021-01-09 NOTE — MAU Provider Note (Signed)
Patient Catherine Le is a 25 y.o. [phone]  At [redacted]w[redacted]d here with complaints of abdominal pain and pelvic pressure after a fall today at 1130-12 at work today. She slipped backwards while walking to the restroom at [employer]. There was water on the floor that she did not see. She denies vaginal bleeding, dysuria, diarrhea, NV, decreased fetal movements, contractions, back pain, leg pain, abnormal vaginal discharge. She did not hit her head.  History     CSN: [id number]  Arrival date and time: 01/09/21 1424   Event Date/Time   First Provider Initiated Contact with Patient 01/09/21 1541      Chief Complaint  Patient presents with   Abdominal Pain   Fall   Decreased Fetal Movement   Abdominal Pain This is a new problem. The current episode started today. The onset quality is sudden. The problem has been waxing and waning. The pain is at a severity of 7/10. The quality of the pain is cramping. Pertinent negatives include no constipation, diarrhea, nausea or vomiting. Nothing aggravates the pain. The pain is relieved by Nothing.  Fall The accident occurred 3 to 6 hours ago. The fall occurred while walking. She fell from a height of 3 to 5 ft. She landed on Hard floor. There was no blood loss. The point of impact was the buttocks. Associated symptoms include abdominal pain. Pertinent negatives include no nausea or vomiting.  She fell backwards while walking. She did not hit her head. She caught herself with her hands but her knees hit her belly.  OB History     Gravida  5   Para  2   Term  2   Preterm      AB  2   Living  2      SAB  2   IAB      Ectopic      Multiple  0   Live Births  2           Past Medical History:  Diagnosis Date   Headache    Irregular bleeding 06/16/2015   [facility] 06/16/2015    Past Surgical History:  Procedure Laterality Date   TONSILLECTOMY      Family History  Problem Relation Age of Onset   Hypertension Father    Asthma Father     Cancer Maternal Aunt        breast   Hypertension Maternal Grandmother    Cancer Maternal Grandmother        lung   Hypertension Maternal Grandfather     Social History   Tobacco Use   Smoking status: Never   Smokeless tobacco: Never  Vaping Use   Vaping Use: Never used  Substance Use Topics   Alcohol use: No   Drug use: No    Allergies:  Allergies  Allergen Reactions   Shrimp [Shellfish Allergy] Shortness Of Breath    Medications Prior to Admission  Medication Sig Dispense Refill Last Dose   aspirin 81 MG EC tablet Take 1 tablet (81 mg total) by mouth daily. Swallow whole. 90 tablet 3 Past Month   Blood Pressure Monitor MISC For regular home bp monitoring during pregnancy 1 each 0    Doxylamine-Pyridoxine (DICLEGIS) 10-10 MG TBEC 2 tabs q hs, if sx persist add 1 tab q am on day 3, if sx persist add 1 tab q afternoon on day 4 100 tablet 6    metoCLOPramide (REGLAN) 10 MG tablet Take 1 tablet (10 mg total)  by mouth 4 (four) times daily. 60 tablet 0    ondansetron (ZOFRAN ODT) 4 MG disintegrating tablet Take 1 tablet (4 mg total) by mouth every 8 (eight) hours as needed for nausea or vomiting. 15 tablet 0    promethazine (PHENERGAN) 25 MG tablet Take 1 tablet (25 mg total) by mouth every 6 (six) hours as needed for nausea or vomiting. 30 tablet 1     Review of Systems  Constitutional: Negative.   HENT: Negative.    Respiratory: Negative.    Cardiovascular: Negative.   Gastrointestinal:  Positive for abdominal pain. Negative for constipation, diarrhea, nausea and vomiting.  Genitourinary: Negative.   Neurological: Negative.   Hematological: Negative.   Psychiatric/Behavioral: Negative.    Physical Exam   Blood pressure 109/76, pulse (!) 101, temperature 99.2 F (37.3 C), temperature source Oral, resp. rate 15, last menstrual period 07/17/2020, SpO2 100 %, unknown if currently breastfeeding.  Physical Exam Constitutional:      Appearance: She is well-developed.   HENT:     Head: Normocephalic.  Abdominal:     General: Abdomen is flat.     Palpations: Abdomen is soft.     Tenderness: There is no abdominal tenderness.     Comments: No bruising, no petechia, no tenderness over abdomen  Neurological:     Mental Status: She is alert.  Cervix is closed/long/posterior MAU Course  Procedures  MDM -US shows no sign of abruption. Patient still with abdominal pain that she endorses as suprapubic, consistent with baby's movement and gestation. The pain is located above her mons, also consistent with RLP.   - She did not have a second cervical exam while in MAU. Patient rested while in MAU and is talking and ambulating without difficulty.   -NST: [name] 140 bpm, mod var, present acel, no decels, no contractions [name]: 135 bpm, mod var, present acel, no decels, no contractions.  Assessment and Plan   1. Fall due to wet surface, initial encounter   2. Supervision of high risk pregnancy in second trimester   3. Dichorionic diamniotic twin pregnancy in second trimester   4. [redacted] weeks gestation of pregnancy   5. NST (non-stress test) reactive   -patient given work note to be out until Jan 2 -discussed warning signs-VB, LOF, decreased fetal movement, stronger abdominal pains, any other concerning symptoms.  -also discussed that she might be sore from fall and that she can do Tylenol at home -discussed that abdominal pain and pressure is normal at this gestation and with twins, most likely MSK. If anything changes she can always return to MAU. She plans to keep upcoming PNV at [facility].  -All questions answered.   Charlesetta Garibaldi Rodgerick Gilliand 01/09/2021, 6:23 PM

## 2021-01-10 NOTE — L&D Delivery Note (Signed)
Neonatology Note:  ? ?Attendance at C-section: ? ?  I was asked by Dr. Despina Hidden to attend this repeat C/S at term. The mother is a 26yo [phone] with good prenatal care complicated by didi twins, TWIN A breech, obesity and non-compliance (for which mother was placed under general anesthesia for delivery).  +GBS, no fever, ROM or labor. ? ?TWIN A:  Breech presentation.  ROM 0h 78m prior to delivery, fluid clear. Infant not vigorous nor with good spontaneous cry and tone. DCC not done; baby brought to warmer immediately and dried and stimulated and bulb suctioned.  HR >100.  Good general response to interventions fairly quick.  Needed some further bulb suctioning. Improving tone, with great resp effort and color. Lungs clearing to ausc.  Apgars 7 at 1 minute, 9 at 5 minutes.  To MBU in care of Pediatrician.   ? ?Dineen Kid. Leary Roca, MD ? ?

## 2021-01-10 NOTE — L&D Delivery Note (Signed)
Neonatology Note:  ? ?Attendance at C-section: ? ?I was asked by Dr. Despina Hidden to attend this repeat C/S at term. The mother is a 26yo [phone] with good prenatal care complicated by didi twins, TWIN A breech, obesity and non-compliance (for which mother was placed under general anesthesia for delivery).  +GBS, no fever, ROM or labor. ?  ?TWIN B:  nl presentation.  ROM 0h 60m prior to delivery, fluid clear. Infant not vigorous nor with good spontaneous cry and tone. DCC not done; baby brought to warmer immediately and dried and stimulated and bulb suctioned.  HR ~60bpm.  PPV initiated with good response in HR to >100.  Resp effort gradually improved SAo2 placed and in low 80s at ~3min. Lungs coarse.  Bulb suctioned more.  CPAP continued until improved tone and resp effort better.  Fio2 titrated down to 21% by 4-5 min.  CPAP removed by 5-31min.  Stable vitals, with good tone, breathing and color on RA. Lungs fairly clear.  Apgars 4 at 1 minute, 9 at 5 minutes.  To MBU in care of Pediatrician.   ?  ?Dineen Kid. Leary Roca, MD ?

## 2021-01-13 ENCOUNTER — Other Ambulatory Visit: Payer: Self-pay | Admitting: Obstetrics & Gynecology

## 2021-01-13 ENCOUNTER — Other Ambulatory Visit: Payer: Medicaid Other

## 2021-01-13 DIAGNOSIS — O30042 Twin pregnancy, dichorionic/diamniotic, second trimester: Secondary | ICD-10-CM

## 2021-01-13 DIAGNOSIS — Z362 Encounter for other antenatal screening follow-up: Secondary | ICD-10-CM

## 2021-01-14 ENCOUNTER — Ambulatory Visit (INDEPENDENT_AMBULATORY_CARE_PROVIDER_SITE_OTHER): Payer: Medicaid Other | Admitting: Obstetrics & Gynecology

## 2021-01-14 ENCOUNTER — Ambulatory Visit (INDEPENDENT_AMBULATORY_CARE_PROVIDER_SITE_OTHER): Payer: Medicaid Other

## 2021-01-14 ENCOUNTER — Other Ambulatory Visit: Payer: Self-pay

## 2021-01-14 ENCOUNTER — Encounter: Payer: Self-pay | Admitting: Obstetrics & Gynecology

## 2021-01-14 VITALS — BP 112/79 | HR 98 | Wt 259.0 lb

## 2021-01-14 DIAGNOSIS — Z3A25 25 weeks gestation of pregnancy: Secondary | ICD-10-CM

## 2021-01-14 DIAGNOSIS — Z362 Encounter for other antenatal screening follow-up: Secondary | ICD-10-CM | POA: Diagnosis not present

## 2021-01-14 DIAGNOSIS — O30042 Twin pregnancy, dichorionic/diamniotic, second trimester: Secondary | ICD-10-CM

## 2021-01-14 DIAGNOSIS — O0992 Supervision of high risk pregnancy, unspecified, second trimester: Secondary | ICD-10-CM

## 2021-01-14 LAB — POCT URINALYSIS DIPSTICK OB
Blood, UA: NEGATIVE
Glucose, UA: NEGATIVE
Ketones, UA: NEGATIVE
Nitrite, UA: NEGATIVE

## 2021-01-14 MED ORDER — OMEPRAZOLE 20 MG PO CPDR: 20.0000 mg | DELAYED_RELEASE_CAPSULE | Freq: Every day | ORAL | 6 refills | Status: DC

## 2021-01-14 NOTE — Progress Notes (Signed)
HIGH-RISK PREGNANCY VISIT Patient name: Catherine Le DOBOSZ MRN [id number]  Date of birth: [birth year] Chief Complaint:   High Risk Gestation ([place] today; fell Sunday; went to MAU; no appetite)  History of Present Illness:   WAVER DIBIASIO is a 26 y.o. [phone] female at [redacted]w[redacted]d with an Estimated Date of Delivery: 04/23/21 being seen today for ongoing management of a high-risk pregnancy complicated by DCDA twins.    Today she reports pelvic pressure. Contractions: Not present. Vag. Bleeding: None.  Movement: Present. denies leaking of fluid.   Depression screen [facility] 2/9 10/06/2020  Decreased Interest 2  Down, Depressed, Hopeless 1  PHQ - 2 Score 3  Altered sleeping 3  Tired, decreased energy 3  Change in appetite 1  Feeling bad or failure about yourself  0  Trouble concentrating 0  Moving slowly or fidgety/restless 0  Suicidal thoughts 0  PHQ-9 Score 10     GAD 7 : Generalized Anxiety Score 10/06/2020  Nervous, Anxious, on Edge 1  Control/stop worrying 0  Worry too much - different things 0  Trouble relaxing 0  Restless 0  Easily annoyed or irritable 2  Afraid - awful might happen 0  Total GAD 7 Score 3     Review of Systems:   Pertinent items are noted in HPI Denies abnormal vaginal discharge w/ itching/odor/irritation, headaches, visual changes, shortness of breath, chest pain, abdominal pain, severe nausea/vomiting, or problems with urination or bowel movements unless otherwise stated above. Pertinent History Reviewed:  Reviewed past medical,surgical, social, obstetrical and family history.  Reviewed problem list, medications and allergies. Physical Assessment:   Vitals:   01/14/21 1434  BP: 112/79  Pulse: 98  Weight: 259 lb (117.5 kg)  Body mass index is 47.37 kg/m.           Physical Examination:   General appearance: alert, well appearing, and in no distress  Mental status: alert, oriented to person, place, and time  Skin: warm & dry   Extremities: Edema: Trace     Cardiovascular: normal heart rate noted  Respiratory: normal respiratory effort, no distress  Abdomen: gravid, soft, non-tender  Pelvic: Cervical exam deferred         Fetal Status:     Movement: Present    Fetal Surveillance Testing today: sonogram normal great growth   Chaperone: N/A    Results for orders placed or performed in visit on 01/14/21 (from the past 24 hour(s))  POC Urinalysis Dipstick OB   Collection Time: 01/14/21  2:37 PM  Result Value Ref Range   Color, UA     Clarity, UA     Glucose, UA Negative Negative   Bilirubin, UA     Ketones, UA neg    Spec Grav, UA     Blood, UA neg    pH, UA     POC,PROTEIN,UA Trace Negative, Trace, Small (1+), Moderate (2+), Large (3+), 4+   Urobilinogen, UA     Nitrite, UA neg    Leukocytes, UA Trace (A) Negative   Appearance     Odor      Assessment & Plan:  High-risk pregnancy: [id number] at [redacted]w[redacted]d with an Estimated Date of Delivery: 04/23/21   1) DCDA, stable, EFW 90/81%   Meds:  Meds ordered this encounter  Medications   omeprazole (PRILOSEC) 20 MG capsule    Sig: Take 1 capsule (20 mg total) by mouth daily. 1 tablet a day    Dispense:  30 capsule    Refill:  6    Labs/procedures today: U/S  Treatment Plan:  PN2    Follow-up: No follow-ups on file.   No future appointments.  Orders Placed This Encounter  Procedures   POC Urinalysis Dipstick OB   Lazaro Arms  01/14/2021 2:52 PM

## 2021-01-14 NOTE — Progress Notes (Signed)
[place] DC/DA TWINS 25+6 wks,cx. 3.4 cm,normal ovaries [name]: Girl,cephalic inferior right,SVP of fluid 5.4 cm,anterior placenta gr 0,FHR 142 bpm,EFW 1052 g 91% [name]: Girl,breech superior left,SVP of fluid 5.9 cm,anterior placenta gr 0,FHR 148 bpm,EFW 1009 g 83%,discordance 4.1%,small stomach bubble,two LVEICF 2.1 & 1.9 mm,anatomy of face and heart complete

## 2021-01-21 ENCOUNTER — Telehealth: Payer: Self-pay | Admitting: *Deleted

## 2021-01-21 NOTE — Telephone Encounter (Signed)
Patient called with c/o numbness and tingling in both legs for the past couple of days.  States it comes and goes but seems to be staying today.  Also have bilateral lower extremity edema.  Discussed with patient that this is normal with pregnancy and especially with carrying twins.  Most likely, twins are on nerves causing the discomfort.  Discussed with Dr Despina Hidden and patient encouraged to look up "prenatal yoga" on youtube to find stretches to help relieve discomfort.  Encouraged maternity band support as well.  Advised if pain worsened or she developed any new symptoms, to let us know or [facility]  Pt verbalized understanding.

## 2021-01-26 ENCOUNTER — Telehealth: Payer: Self-pay | Admitting: Obstetrics & Gynecology

## 2021-01-26 NOTE — Telephone Encounter (Signed)
Patient called stating that her Employment have faxed over some paperwork for her leave but the dates might be a conflict because Dr. Genevie Ann informed her that she was not going to go pass 04/09/2021 and it states that her she will deliver 04/13/2021. Patient would like for Tish to speak with Dr. Despina Hidden regarding this. Employer couldn't get it fax the paperwork so she is having it mailed to my e-mail and I can print it out. Please contact pt

## 2021-02-04 ENCOUNTER — Encounter: Payer: Self-pay | Admitting: Obstetrics & Gynecology

## 2021-02-04 ENCOUNTER — Other Ambulatory Visit: Payer: Self-pay

## 2021-02-04 ENCOUNTER — Other Ambulatory Visit: Payer: Medicaid Other

## 2021-02-04 ENCOUNTER — Ambulatory Visit (INDEPENDENT_AMBULATORY_CARE_PROVIDER_SITE_OTHER): Payer: Medicaid Other | Admitting: Obstetrics & Gynecology

## 2021-02-04 VITALS — BP 109/71 | HR 118 | Wt 264.0 lb

## 2021-02-04 DIAGNOSIS — O0993 Supervision of high risk pregnancy, unspecified, third trimester: Secondary | ICD-10-CM | POA: Diagnosis not present

## 2021-02-04 DIAGNOSIS — O368121 Decreased fetal movements, second trimester, fetus 1: Secondary | ICD-10-CM

## 2021-02-04 DIAGNOSIS — Z3A28 28 weeks gestation of pregnancy: Secondary | ICD-10-CM | POA: Diagnosis not present

## 2021-02-04 DIAGNOSIS — Z131 Encounter for screening for diabetes mellitus: Secondary | ICD-10-CM

## 2021-02-04 NOTE — Progress Notes (Signed)
HIGH-RISK PREGNANCY VISIT Patient name: Catherine Le MRN [id number]  Date of birth: [birth year] Chief Complaint:   High Risk Gestation (Left leg is going numb)  History of Present Illness:   Catherine Le is a 26 y.o. [phone] female at [redacted]w[redacted]d with an Estimated Date of Delivery: 04/23/21 being seen today for ongoing management of a high-risk pregnancy complicated by:  DC/DA twins.    Per Marcie Bal, she had report fetal movement; however, she reports to me that she has not felt Twin A move for a few days.  Notes good movement of Twin B.  Contractions: Not present. Vag. Bleeding: None.  Movement: Present. denies leaking of fluid.   Depression screen [facility] 2/9 02/04/2021 10/06/2020  Decreased Interest 0 2  Down, Depressed, Hopeless 0 1  PHQ - 2 Score 0 3  Altered sleeping 2 3  Tired, decreased energy 2 3  Change in appetite 0 1  Feeling bad or failure about yourself  0 0  Trouble concentrating 0 0  Moving slowly or fidgety/restless 0 0  Suicidal thoughts 0 0  PHQ-9 Score 4 10     Current Outpatient Medications  Medication Instructions   aspirin 81 mg, Oral, Daily, Swallow whole.   Blood Pressure Monitor MISC For regular home bp monitoring during pregnancy   Doxylamine-Pyridoxine (DICLEGIS) 10-10 MG TBEC 2 tabs q hs, if sx persist add 1 tab q am on day 3, if sx persist add 1 tab q afternoon on day 4   metoCLOPramide (REGLAN) 10 mg, Oral, 4 times daily   omeprazole (PRILOSEC) 20 mg, Oral, Daily, 1 tablet a day     Review of Systems:   Pertinent items are noted in HPI Denies abnormal vaginal discharge w/ itching/odor/irritation, headaches, visual changes, shortness of breath, chest pain, abdominal pain, severe nausea/vomiting, or problems with urination or bowel movements unless otherwise stated above. Pertinent History Reviewed:  Reviewed past medical,surgical, social, obstetrical and family history.  Reviewed problem list, medications and allergies. Physical Assessment:    Vitals:   02/04/21 0858  BP: 109/71  Pulse: (!) 118  Weight: 264 lb (119.7 kg)  Body mass index is 48.29 kg/m.           Physical Examination:   General appearance: alert, well appearing, and in no distress  Mental status: normal mood, behavior, speech, dress, motor activity, and thought processes  Skin: warm & dry   Extremities: Edema: Trace    Cardiovascular: normal heart rate noted  Respiratory: normal respiratory effort, no distress  Abdomen: gravid, soft, non-tender  Pelvic: Cervical exam deferred         Fetal Status:     Movement: Present   Twin A: 130, Twin B: 140bpm by doppler  Fetal Surveillance Testing today: NST   NST being performed due to decreased fetal movement   Fetal Monitoring:  Baseline: 120 bpm, Variability: moderate, Accelerations: +10x10 accels x2 in 20 minutes, and Decelerations: Absent   reactive  Twin B: 140, moderate variability, +10x10 accels, absent decels- Reactive NST   Final diagnosis:  Reactive NST for both twins   Chaperone: N/A    No results found for this or any previous visit (from the past 24 hour(s)).   Assessment & Plan:  High-risk pregnancy: [id number] at [redacted]w[redacted]d with an Estimated Date of Delivery: 04/23/21   1) DC/DA twins -plan for growth every 4 wks -antepartum testing to start @ 36wks  2) Decreased movement  -Reactive NST for 28wks -Should movement not improve in the  next 24hr advised pt to go to [facility] for further evaluation   Meds: No orders of the defined types were placed in this encounter.   Labs/procedures today: NST, PN-2 today  Treatment Plan:  as outlined above  Reviewed: Preterm labor symptoms and general obstetric precautions including but not limited to vaginal bleeding, contractions, leaking of fluid and fetal movement were reviewed in detail with the patient.  All questions were answered. Pt has home bp cuff. Check bp weekly, let us know if >140/90.   Follow-up: Return in about 4 weeks (around 03/04/2021)  for Growth twins next available AND then every 4 wks.   Future appointments to be scheduled   Janyth Pupa, DO Attending [place], [facility] for [facility], [place]

## 2021-02-05 ENCOUNTER — Other Ambulatory Visit: Payer: Self-pay | Admitting: Obstetrics & Gynecology

## 2021-02-05 ENCOUNTER — Ambulatory Visit (INDEPENDENT_AMBULATORY_CARE_PROVIDER_SITE_OTHER): Payer: Medicaid Other

## 2021-02-05 DIAGNOSIS — O30042 Twin pregnancy, dichorionic/diamniotic, second trimester: Secondary | ICD-10-CM

## 2021-02-05 DIAGNOSIS — Z3A29 29 weeks gestation of pregnancy: Secondary | ICD-10-CM | POA: Diagnosis not present

## 2021-02-05 DIAGNOSIS — O0992 Supervision of high risk pregnancy, unspecified, second trimester: Secondary | ICD-10-CM

## 2021-02-05 LAB — CBC
Hematocrit: 30.3 % — ABNORMAL LOW (ref 34.0–46.6)
Hemoglobin: 9.9 g/dL — ABNORMAL LOW (ref 11.1–15.9)
MCH: 28 pg (ref 26.6–33.0)
MCHC: 32.7 g/dL (ref 31.5–35.7)
MCV: 86 fL (ref 79–97)
Platelets: 329 10*3/uL (ref 150–450)
RBC: 3.53 x10E6/uL — ABNORMAL LOW (ref 3.77–5.28)
RDW: 13.1 % (ref 11.7–15.4)
WBC: 12.7 10*3/uL — ABNORMAL HIGH (ref 3.4–10.8)

## 2021-02-05 LAB — GLUCOSE TOLERANCE, 2 HOURS W/ 1HR
Glucose, 1 hour: 88 mg/dL (ref 70–179)
Glucose, 2 hour: 120 mg/dL (ref 70–152)
Glucose, Fasting: 78 mg/dL (ref 70–91)

## 2021-02-05 LAB — RPR: RPR Ser Ql: NONREACTIVE

## 2021-02-05 LAB — ANTIBODY SCREEN: Antibody Screen: NEGATIVE

## 2021-02-05 LAB — HIV ANTIBODY (ROUTINE TESTING W REFLEX): HIV Screen 4th Generation wRfx: NONREACTIVE

## 2021-02-09 ENCOUNTER — Other Ambulatory Visit: Payer: Self-pay | Admitting: Women's Health

## 2021-02-09 ENCOUNTER — Telehealth: Payer: Self-pay | Admitting: Obstetrics & Gynecology

## 2021-02-09 DIAGNOSIS — O0993 Supervision of high risk pregnancy, unspecified, third trimester: Secondary | ICD-10-CM

## 2021-02-09 DIAGNOSIS — Z029 Encounter for administrative examinations, unspecified: Secondary | ICD-10-CM

## 2021-02-09 MED ORDER — FERROUS SULFATE 325 (65 FE) MG PO TABS: 325.0000 mg | ORAL_TABLET | ORAL | 2 refills | Status: DC

## 2021-02-09 NOTE — Telephone Encounter (Signed)
Patient called stating that needs a letter for her Job stating that on 12/16/2020 Dr. Despina Hidden released her from her duties at work due to pregnancy. Her job needs this letter to hold her position until she delivers the baby. Please contact pt when letter is ready. Please contact pt

## 2021-02-09 NOTE — Telephone Encounter (Signed)
Spoke with [employer].  They are waiting on the forms that needed additional information which was faxed to them today.  At this time, no additional information is needed.  Informed Dr Despina Hidden was out of the office last week which is the reason these forms have been delayed in sending in.

## 2021-03-03 ENCOUNTER — Other Ambulatory Visit: Payer: Self-pay | Admitting: Obstetrics & Gynecology

## 2021-03-03 DIAGNOSIS — O30043 Twin pregnancy, dichorionic/diamniotic, third trimester: Secondary | ICD-10-CM

## 2021-03-04 ENCOUNTER — Ambulatory Visit (INDEPENDENT_AMBULATORY_CARE_PROVIDER_SITE_OTHER): Payer: Medicaid Other

## 2021-03-04 ENCOUNTER — Encounter: Payer: Self-pay | Admitting: Obstetrics & Gynecology

## 2021-03-04 ENCOUNTER — Ambulatory Visit (INDEPENDENT_AMBULATORY_CARE_PROVIDER_SITE_OTHER): Payer: Medicaid Other | Admitting: Obstetrics & Gynecology

## 2021-03-04 ENCOUNTER — Other Ambulatory Visit: Payer: Self-pay

## 2021-03-04 VITALS — BP 105/72 | HR 108 | Wt 263.0 lb

## 2021-03-04 DIAGNOSIS — O30043 Twin pregnancy, dichorionic/diamniotic, third trimester: Secondary | ICD-10-CM | POA: Diagnosis not present

## 2021-03-04 DIAGNOSIS — Z3A32 32 weeks gestation of pregnancy: Secondary | ICD-10-CM

## 2021-03-04 DIAGNOSIS — O0993 Supervision of high risk pregnancy, unspecified, third trimester: Secondary | ICD-10-CM

## 2021-03-04 NOTE — Progress Notes (Signed)
[place] 32+6 wks,DC/DA TWINS,cx length 4.2 cm [name]: female,complete breech right inferior,anterior placenta gr 2,SVP of fluid 5.5 cm,FHR 138 bpm,EFW 2347 g 78% [name]: female,transverse head left superior,anterior placenta gr 2,FHR 140 bpm,SVP of fluid 4.3 cm,EFW 2292 g 72%

## 2021-03-04 NOTE — Progress Notes (Signed)
HIGH-RISK PREGNANCY VISIT Patient name: Catherine Le MRN [id number]  Date of birth: 1995-05-20 Chief Complaint:   Routine Prenatal Visit  History of Present Illness:   Catherine Le is a 26 y.o. [phone] female at [redacted]w[redacted]d with an Estimated Date of Delivery: 04/23/21 being seen today for ongoing management of a high-risk pregnancy complicated by: DC/DA twins  Today she reports occasional contractions.   Contractions: Not present. Vag. Bleeding: None.  Movement: Present. denies leaking of fluid.   Depression screen [facility] 2/9 02/04/2021 10/06/2020  Decreased Interest 0 2  Down, Depressed, Hopeless 0 1  PHQ - 2 Score 0 3  Altered sleeping 2 3  Tired, decreased energy 2 3  Change in appetite 0 1  Feeling bad or failure about yourself  0 0  Trouble concentrating 0 0  Moving slowly or fidgety/restless 0 0  Suicidal thoughts 0 0  PHQ-9 Score 4 10     Current Outpatient Medications  Medication Instructions   aspirin 81 mg, Oral, Daily, Swallow whole.   Blood Pressure Monitor MISC For regular home bp monitoring during pregnancy   Doxylamine-Pyridoxine (DICLEGIS) 10-10 MG TBEC 2 tabs q hs, if sx persist add 1 tab q am on day 3, if sx persist add 1 tab q afternoon on day 4   ferrous sulfate 325 mg, Oral, Every other day   metoCLOPramide (REGLAN) 10 mg, Oral, 4 times daily   omeprazole (PRILOSEC) 20 mg, Oral, Daily, 1 tablet a day     Review of Systems:   Pertinent items are noted in HPI Denies abnormal vaginal discharge w/ itching/odor/irritation, headaches, visual changes, shortness of breath, chest pain, abdominal pain, severe nausea/vomiting, or problems with urination or bowel movements unless otherwise stated above. Pertinent History Reviewed:  Reviewed past medical,surgical, social, obstetrical and family history.  Reviewed problem list, medications and allergies. Physical Assessment:   Vitals:   03/04/21 1438  BP: 105/72  Pulse: (!) 108  Weight: 263 lb (119.3 kg)   Body mass index is 48.1 kg/m.           Physical Examination:   General appearance: alert, well appearing, and in no distress  Mental status: normal mood, behavior, speech, dress, motor activity, and thought processes  Skin: warm & dry   Extremities: Edema: Trace    Cardiovascular: normal heart rate noted  Respiratory: normal respiratory effort, no distress  Abdomen: gravid, soft, non-tender  Pelvic: Cervical exam deferred         Fetal Status:     Movement: Present    Fetal Surveillance Testing today: growth scan [place] 32+6 wks,DC/DA TWINS,cx length 4.2 cm [name]: female,complete breech right inferior,anterior placenta gr 2,SVP of fluid 5.5 cm,FHR 138 bpm,EFW 2347 g 78% [name]: female,transverse head left superior,anterior placenta gr 2,FHR 140 bpm,SVP of fluid 4.3 cm,EFW 2292 g 72%    Chaperone: N/A    No results found for this or any previous visit (from the past 24 hour(s)).   Assessment & Plan:  High-risk pregnancy: [id number] at [redacted]w[redacted]d with an Estimated Date of Delivery: 04/23/21   1) DC/DA twins- discussed breech presentation of Twin A.  Reports that she does not want a C-section.  Interested in "turning the baby."  Typically, ECV is not offered for twins; however, it may be considered though suspect success rate is low.  For now will continue to monitor position- briefly discussed risk of breech delivery and current recommendations.  Pt reports that if she "has to" then she would,  but she would prefer to avoid it.  -continue growth q 4wks -weekly BPP/antepartum testing @ 36wk -IOL @ 38wks  2) Contraceptive management- desires BTL- reviewed risk/benefit and discussed salpingectomy- declined salpingectomy, only wants tubal ligation in case they want it reversed.  Discussed that reversal is not an ideal option and often REI elects for IVF over reversal.  IF she is not 100% committed to permanent sterilization, reviewed alternative options.  She expressed desires for tubal ligation.   Inform consent obtained  Meds: No orders of the defined types were placed in this encounter.   Labs/procedures today: growth scan  Treatment Plan:  as outlined above  Reviewed: Preterm labor symptoms and general obstetric precautions including but not limited to vaginal bleeding, contractions, leaking of fluid and fetal movement were reviewed in detail with the patient.  All questions were answered.   Follow-up: Return in about 2 weeks (around 03/18/2021) for [place] visit.   Future Appointments  Date Time Provider [place]  03/04/2021  3:10 PM Janyth Pupa, DO [facility]  03/31/2021  1:30 PM [facility] [place] [facility] None  03/31/2021  3:10 PM Janyth Pupa, DO [facility]  04/07/2021  1:30 PM [facility] [place] [facility] None  04/07/2021  3:30 PM Roma Schanz, CNM [facility]  04/14/2021  1:30 PM [place] - [facility] [place] [facility] None  04/14/2021  3:30 PM Myrtis Ser, CNM [facility]  04/21/2021  1:30 PM [place] - [facility] [place] [facility] None  04/21/2021  3:10 PM Janyth Pupa, DO [facility]    No orders of the defined types were placed in this encounter.   Janyth Pupa, DO Attending [place], [facility] for [employer], [place]

## 2021-03-18 ENCOUNTER — Encounter: Payer: Medicaid Other | Admitting: Obstetrics & Gynecology

## 2021-03-24 ENCOUNTER — Other Ambulatory Visit: Payer: Self-pay | Admitting: Obstetrics & Gynecology

## 2021-03-24 DIAGNOSIS — O30043 Twin pregnancy, dichorionic/diamniotic, third trimester: Secondary | ICD-10-CM

## 2021-03-25 ENCOUNTER — Other Ambulatory Visit: Payer: Self-pay

## 2021-03-25 ENCOUNTER — Other Ambulatory Visit (HOSPITAL_COMMUNITY)
Admission: RE | Admit: 2021-03-25 | Discharge: 2021-03-25 | Disposition: A | Discharge status: Home / Self Care | Payer: Medicaid Other | Source: Ambulatory Visit | Attending: Obstetrics & Gynecology | Admitting: Obstetrics & Gynecology

## 2021-03-25 ENCOUNTER — Ambulatory Visit: Payer: Medicaid Other

## 2021-03-25 ENCOUNTER — Other Ambulatory Visit: Payer: Medicaid Other

## 2021-03-25 ENCOUNTER — Encounter: Payer: Self-pay | Admitting: Obstetrics & Gynecology

## 2021-03-25 ENCOUNTER — Ambulatory Visit (INDEPENDENT_AMBULATORY_CARE_PROVIDER_SITE_OTHER): Payer: Medicaid Other | Admitting: Obstetrics & Gynecology

## 2021-03-25 ENCOUNTER — Inpatient Hospital Stay (HOSPITAL_COMMUNITY)
Admission: AD | Admit: 2021-03-25 | Discharge: 2021-03-25 | Disposition: A | Discharge status: Home / Self Care | Payer: Medicaid Other | Attending: Obstetrics and Gynecology | Admitting: Obstetrics and Gynecology

## 2021-03-25 VITALS — BP 108/75 | HR 102 | Wt 272.0 lb

## 2021-03-25 DIAGNOSIS — O321XX0 Maternal care for breech presentation, not applicable or unspecified: Secondary | ICD-10-CM | POA: Diagnosis not present

## 2021-03-25 DIAGNOSIS — O30043 Twin pregnancy, dichorionic/diamniotic, third trimester: Secondary | ICD-10-CM | POA: Insufficient documentation

## 2021-03-25 DIAGNOSIS — Z3A35 35 weeks gestation of pregnancy: Secondary | ICD-10-CM | POA: Insufficient documentation

## 2021-03-25 DIAGNOSIS — O479 False labor, unspecified: Secondary | ICD-10-CM

## 2021-03-25 DIAGNOSIS — O0993 Supervision of high risk pregnancy, unspecified, third trimester: Secondary | ICD-10-CM | POA: Insufficient documentation

## 2021-03-25 LAB — URINALYSIS, ROUTINE W REFLEX MICROSCOPIC
Bilirubin Urine: NEGATIVE
Glucose, UA: NEGATIVE mg/dL
Hgb urine dipstick: NEGATIVE
Ketones, ur: NEGATIVE mg/dL
Leukocytes,Ua: NEGATIVE
Nitrite: NEGATIVE
Protein, ur: 30 mg/dL — AB
Specific Gravity, Urine: 1.02 (ref 1.005–1.030)
Squamous Epithelial / HPF: >50 — ABNORMAL HIGH (ref 0–5)
pH: 6 (ref 5.0–8.0)

## 2021-03-25 NOTE — Discharge Instructions (Signed)

## 2021-03-25 NOTE — Progress Notes (Signed)
? ?HIGH-RISK PREGNANCY VISIT ?Patient name: Catherine Le MRN [id number]  Date of birth: 1995-04-30 ?Chief Complaint:   ?High Risk Gestation (NST today; + pelvic pain) ? ?History of Present Illness:   ?Catherine Le is a 26 y.o. [phone] female at [redacted]w[redacted]d with an Estimated Date of Delivery: 04/23/21 being seen today for ongoing management of a high-risk pregnancy complicated by: ? ?-DC/DA twins ? ?Today she reports  vaginal pressure and discomfort. Started a few days ago and seems to have gotten more intense since yesterday .  ? ?Contractions: Irritability. Vag. Bleeding: None.  Movement: Present. denies leaking of fluid.  ? ?Depression screen [facility] 2/9 02/04/2021 10/06/2020  ?Decreased Interest 0 2  ?Down, Depressed, Hopeless 0 1  ?PHQ - 2 Score 0 3  ?Altered sleeping 2 3  ?Tired, decreased energy 2 3  ?Change in appetite 0 1  ?Feeling bad or failure about yourself  0 0  ?Trouble concentrating 0 0  ?Moving slowly or fidgety/restless 0 0  ?Suicidal thoughts 0 0  ?PHQ-9 Score 4 10  ? ? ? ?Current Outpatient Medications  ?Medication Instructions  ? aspirin 81 mg, Oral, Daily, Swallow whole.  ? Blood Pressure Monitor MISC For regular home bp monitoring during pregnancy  ? Doxylamine-Pyridoxine (DICLEGIS) 10-10 MG TBEC 2 tabs q hs, if sx persist add 1 tab q am on day 3, if sx persist add 1 tab q afternoon on day 4  ? ferrous sulfate 325 mg, Oral, Every other day  ? metoCLOPramide (REGLAN) 10 mg, Oral, 4 times daily  ? omeprazole (PRILOSEC) 20 mg, Oral, Daily, 1 tablet a day  ?  ? ?Review of Systems:   ?Pertinent items are noted in HPI ?Denies abnormal vaginal discharge w/ itching/odor/irritation, headaches, visual changes, shortness of breath, chest pain, abdominal pain, severe nausea/vomiting, or problems with urination or bowel movements unless otherwise stated above. ?Pertinent History Reviewed:  ?Reviewed past medical,surgical, social, obstetrical and family history.  ?Reviewed problem list, medications and  allergies. ?Physical Assessment:  ? ?Vitals:  ? 03/25/21 1358  ?BP: 108/75  ?Pulse: (!) 102  ?Weight: 272 lb (123.4 kg)  ?Body mass index is 49.75 kg/m?. ?     ?     Physical Examination:  ? General appearance: alert, well appearing, and in no distress ? Mental status: normal mood, behavior, speech, dress, motor activity, and thought processes ? Skin: warm & dry  ? Extremities: Edema: Trace  ?  Cardiovascular: normal heart rate noted ? Respiratory: normal respiratory effort, no distress ? Abdomen: gravid, soft, non-tender ? Pelvic: Cervical exam performed  Dilation: Closed Effacement (%): Thick Station: -3 ? ?Fetal Status:     Movement: Present   ? ?Twin A- breech, 130 moderate variability- difficulty with tracing due to fetal movement, +10x10 accels, no decelds ?Twin B: transverse: 140, moderate variability, +10x10 accels, no decels ? ? ?Final diagnosis:   Reactive NST ? ? ?  ? ?Chaperone: Levy Pupa   ? ?No results found for this or any previous visit (from the past 24 hour(s)).  ? ?Assessment & Plan:  ?High-risk pregnancy: [id number] at [redacted]w[redacted]d with an Estimated Date of Delivery: 04/23/21  ? ?1) DC/DA twins ?-continue weekly testing ?-plan for IOL @ 38wks ? ?2) Preterm contractions ?-plan to send to MAU for further monitoring ? ?Meds: No orders of the defined types were placed in this encounter. ? ? ?Labs/procedures today: NSTx2- appropriate for gestational age ? ?Treatment Plan:  as outlined above ? ?Reviewed: Preterm labor symptoms and  general obstetric precautions including but not limited to vaginal bleeding, contractions, leaking of fluid and fetal movement were reviewed in detail with the patient.  All questions were answered. Pt has home bp cuff. Check bp weekly, let us know if >140/90.  ? ?Follow-up: Return for weekly as scheduled. ? ? ?Future Appointments  ?Date Time Provider [place]  ?03/25/2021  3:10 PM Janyth Pupa, DO [facility]  ?04/07/2021  1:30 PM [facility] [place] [facility] None   ?04/07/2021  3:30 PM Roma Schanz, CNM [facility]  ?04/14/2021  1:30 PM [facility] [place] [facility] None  ?04/14/2021  3:30 PM Myrtis Ser, CNM [facility]  ?04/21/2021  1:30 PM [facility] [place] [facility] None  ?04/21/2021  3:10 PM Janyth Pupa, DO [facility]  ? ? ?No orders of the defined types were placed in this encounter. ? ? ?Janyth Pupa, DO ?Attending [place], Faculty Practice ?[facility] [place] ? ? ? ?

## 2021-03-25 NOTE — MAU Note (Signed)
.  Catherine Le is a 26 y.o. at [redacted]w[redacted]d here in MAU reporting: sent from office for ctx. Went to regular office visit for u/s and monitoring and was told she was having ctx .pt is feeling them but rates them 5/10. Good fetal movement reported. Denies any vag bleeding or discharge ?Onset of complaint: this afternoon ?Pain score: 5/10 ?There were no vitals filed for this visit.   ?FHT:A-150/B-145 ?Lab orders placed from triage:  U/A ? ?

## 2021-03-25 NOTE — Addendum Note (Signed)
Addended by: Moss Mc on: 03/25/2021 03:30 PM ? ? Modules accepted: Orders ? ?

## 2021-03-25 NOTE — MAU Provider Note (Signed)
None  ?  ? ?Chief Complaint:  Contractions ? ? ?Catherine Le is  26 y.o. [id number] at [redacted]w[redacted]d presents complaining of Contractions ?Marland Kitchen  She states irregular, every 8-10 minutes contractions for 2-3 days.  She had a NST in the office today, was noted to be contracting, and advised to come for evaluation.  Her cx was LTC in the office. [name] complete breech on last [place], 03/04/21. Feels like ctx are stronger today than the previous few days.  ? ?Obstetrical/Gynecological History: ?OB History   ? ? Gravida  ?5  ? Para  ?2  ? Term  ?2  ? Preterm  ?   ? AB  ?2  ? Living  ?2  ?  ? ? SAB  ?2  ? IAB  ?   ? Ectopic  ?   ? Multiple  ?0  ? Live Births  ?2  ?   ?  ?  ? ?Past Medical History: ?Past Medical History:  ?Diagnosis Date  ? Headache   ? Irregular bleeding 06/16/2015  ? Moody 06/16/2015  ? ? ?Past Surgical History: ?Past Surgical History:  ?Procedure Laterality Date  ? TONSILLECTOMY    ? ? ?Family History: ?Family History  ?Problem Relation Age of Onset  ? Hypertension Father   ? Asthma Father   ? Cancer Maternal Aunt   ?     breast  ? Hypertension Maternal Grandmother   ? Cancer Maternal Grandmother   ?     lung  ? Hypertension Maternal Grandfather   ? ? ?Social History: ?Social History  ? ?Tobacco Use  ? Smoking status: Never  ? Smokeless tobacco: Never  ?Vaping Use  ? Vaping Use: Never used  ?Substance Use Topics  ? Alcohol use: No  ? Drug use: No  ? ? ?Allergies:  ?Allergies  ?Allergen Reactions  ? Shrimp [Shellfish Allergy] Shortness Of Breath  ? ? ?Meds:  ?Medications Prior to Admission  ?Medication Sig Dispense Refill Last Dose  ? aspirin 81 MG EC tablet Take 1 tablet (81 mg total) by mouth daily. Swallow whole. 90 tablet 3 03/25/2021  ? Doxylamine-Pyridoxine (DICLEGIS) 10-10 MG TBEC 2 tabs q hs, if sx persist add 1 tab q am on day 3, if sx persist add 1 tab q afternoon on day 4 100 tablet 6 03/25/2021  ? ferrous sulfate 325 (65 FE) MG tablet Take 1 tablet (325 mg total) by mouth every other day. 45 tablet 2  03/25/2021  ? metoCLOPramide (REGLAN) 10 MG tablet Take 1 tablet (10 mg total) by mouth 4 (four) times daily. 60 tablet 0 03/25/2021  ? omeprazole (PRILOSEC) 20 MG capsule Take 1 capsule (20 mg total) by mouth daily. 1 tablet a day 30 capsule 6 03/25/2021  ? Blood Pressure Monitor MISC For regular home bp monitoring during pregnancy 1 each 0   ? ? ?Review of Systems  ? ?Constitutional: Negative for fever and chills ?Eyes: Negative for visual disturbances ?Respiratory: Negative for shortness of breath, dyspnea ?Cardiovascular: Negative for chest pain or palpitations  ?Gastrointestinal: Negative for vomiting, diarrhea and constipation ?Genitourinary: Negative for dysuria and urgency ?Musculoskeletal: Negative for back pain, joint pain, myalgias.  Normal ROM  ?Neurological: Negative for dizziness and headaches ? ? ? ?Physical Exam  ?Blood pressure 99/61, pulse (!) 118, temperature 98.3 ?F (36.8 ?C), last menstrual period 07/17/2020, unknown if currently breastfeeding. ?GENERAL: Well-developed, well-nourished female in no acute distress.  ?LUNGS: Normal respiratory effort ?HEART: Regular rate and rhythm. ?ABDOMEN: Soft, nontender,  nondistended, gravid.  ?EXTREMITIES: Nontender, no edema, 2+ distal pulses. ?DTR's 2+ ?CERVICAL EXAM: Dilatation 0cm   Effacement 0%   Station -3   ?Bedside US: Pt informed that the ultrasound is considered a limited OB ultrasound and is not intended to be a complete ultrasound exam.  Patient also informed that the ultrasound is not being completed with the intent of assessing for fetal or placental anomalies or any pelvic abnormalities.  Explained that the purpose of today?s ultrasound is to assess for  presentation.  Patient acknowledges the purpose of the exam and the limitations of the study.  My interpretation:  [name] complete breech ? ?FHT:  ? ?[name]: Baseline rate 140 bpm   Variability moderate  Accelerations present   Decelerations none ? ?[name]:Baseline rate 135 bpm   Variability  moderate  Accelerations present  Decelerations none ?Contractions: Every 8-10 mins ?  ?Labs: ?No results found for this or any previous visit (from the past 24 hour(s)). ?Imaging Studies:  ? ? ?Assessment: ?Catherine Le is  26 y.o. [id number] at [redacted]w[redacted]d presents with preterm contractions, no cervical change in at least 4 hours. ? ?Plan: ?DC home w/labor precautions.  Message sent to office to schedule her for an appt next week.  ? ?Catherine Le ?3/16/20237:29 PM ? ?

## 2021-03-26 ENCOUNTER — Inpatient Hospital Stay (HOSPITAL_COMMUNITY)
Admission: AD | Admit: 2021-03-26 | Discharge: 2021-03-27 | Disposition: A | Discharge status: Home / Self Care | Payer: Medicaid Other | Attending: Obstetrics & Gynecology | Admitting: Obstetrics & Gynecology

## 2021-03-26 ENCOUNTER — Other Ambulatory Visit: Payer: Self-pay

## 2021-03-26 ENCOUNTER — Encounter (HOSPITAL_COMMUNITY): Payer: Self-pay | Admitting: Obstetrics & Gynecology

## 2021-03-26 DIAGNOSIS — O4703 False labor before 37 completed weeks of gestation, third trimester: Secondary | ICD-10-CM | POA: Insufficient documentation

## 2021-03-26 DIAGNOSIS — O30043 Twin pregnancy, dichorionic/diamniotic, third trimester: Secondary | ICD-10-CM

## 2021-03-26 DIAGNOSIS — O30042 Twin pregnancy, dichorionic/diamniotic, second trimester: Secondary | ICD-10-CM

## 2021-03-26 DIAGNOSIS — O479 False labor, unspecified: Secondary | ICD-10-CM

## 2021-03-26 DIAGNOSIS — Z3A36 36 weeks gestation of pregnancy: Secondary | ICD-10-CM | POA: Insufficient documentation

## 2021-03-26 DIAGNOSIS — Z3689 Encounter for other specified antenatal screening: Secondary | ICD-10-CM

## 2021-03-26 DIAGNOSIS — O0993 Supervision of high risk pregnancy, unspecified, third trimester: Secondary | ICD-10-CM

## 2021-03-26 NOTE — MAU Note (Signed)
PT SAYS SHE WAS HERE YESTERDAY - CLOSED ?UC STILL STRONG  ?HAD U/S [facility]  AT [facility] YESTERDAY - BUT TECH WAS ABSENT  ? ? ?

## 2021-03-27 DIAGNOSIS — Z3A36 36 weeks gestation of pregnancy: Secondary | ICD-10-CM | POA: Diagnosis not present

## 2021-03-27 DIAGNOSIS — O4703 False labor before 37 completed weeks of gestation, third trimester: Secondary | ICD-10-CM | POA: Diagnosis present

## 2021-03-27 DIAGNOSIS — O479 False labor, unspecified: Secondary | ICD-10-CM

## 2021-03-27 MED ORDER — OXYCODONE-ACETAMINOPHEN 5-325 MG PO TABS: 1.0000 | ORAL_TABLET | Freq: Four times a day (QID) | ORAL | 0 refills | Status: DC | PRN

## 2021-03-27 MED ORDER — OXYCODONE-ACETAMINOPHEN 5-325 MG PO TABS
2.0000 | ORAL_TABLET | Freq: Once | ORAL | Status: AC
  Administered 2021-03-27: 2 via ORAL
  Filled 2021-03-27: qty 2

## 2021-03-27 NOTE — MAU Note (Signed)
Monitors off per provider-discussed with pt definition of labor and the absence of cervical change. Discussed comfort measures for home. PO medication requested. Pt declined IM pain medication. ?

## 2021-03-27 NOTE — MAU Provider Note (Addendum)
None  ?  ? ? ?S: Ms. JANEECE BLOK is a 26 y.o. [phone] at [redacted]w[redacted]d  who presents to MAU today complaining of contractions since yesterday. She denies vaginal bleeding. She denies LOF. She reports normal fetal movement x 2.   ? ?O: BP 112/70 (BP Location: Right Arm)   Pulse (!) 103   Temp 98.3 ?F (36.8 ?C) (Oral)   Resp 20   Ht 5\' 1"  (1.549 m)   Wt 125.9 kg   LMP 07/17/2020   BMI 52.45 kg/m?  ?GENERAL: Well-developed, well-nourished female in no acute distress.  ?HEAD: Normocephalic, atraumatic.  ?CHEST: Normal effort of breathing, regular heart rate ?ABDOMEN: Soft, nontender, gravid ? ?Cervical exam:  ?Dilation: Closed ?Effacement (%): Thick ?Cervical Position: Posterior ?Station: Ballotable ?Presentation: Undeterminable ?Exam by:: J.Amauri Medellin, CNM ? ? ?Fetal Monitoring: ?Twin A ?Baseline: 125 ?Variability: Moderate ?Accelerations: 15x15 ?Decelerations: None ? ?Twin B ?Baseline: 145 ?Variability: Moderate ?Accelerations: 15x15 ?Decelerations: None ? ?Contractions: Irregular ? ?Patient informed that the ultrasound is considered a limited OB ultrasound and is not intended to be a complete ultrasound exam.  Patient also informed that the ultrasound is not being completed with the intent of assessing for fetal or placental anomalies or any pelvic abnormalities.  Explained that the purpose of today?s ultrasound is to assess for  presentation.  Patient acknowledges the purpose of the exam and the limitations of the study. ? ?Twin A: Transverse, Fetal Head Maternal Right ?Twin B: Vertex with fetal head on Maternal Left side ? ?A: ?TIUP at [redacted]w[redacted]d  ?False labor ?Cat I FT ? ?P: ?-No change in VE ?-Patient requests pain medication, but declines IM injection. ?-Will give percocet tablets. ?-Labor precautions discussed. ?-Patient questions when she will be induced. ?-Instructed to discuss with her primary office.  Informed that twin deliveries, less complications, occurs between 37-38 weeks per MFM protocol. However, may  differ based on other factors. ?-Patient without other q/c. ?-NST reactive x 2 ?-Discharged to home in stable condition. ? ?[redacted]w[redacted]d, CNM ?03/27/2021 2:30 AM ? ?Reassessment (3:36 AM) ?-Patient requesting consult with Dr. 03/29/2021 regarding pain management and possible delivery. ?-Dr. Despina Hidden contacted and currently unavailable.  Instructs provider to send message to office for patient to be seen and scheduled on Monday or Tuesday for visit. ?-Provider to bedside to discuss. ?-Patient informed that in absence of emergency or/or labor no need for delivery tonight.   ?-Discussed pain management with percocet over the weekend and patient agreeable. States recent dosage is starting to work. ?-Message sent. ?-Limited script for Percocet sent to pharmacy on file.  ? ?Wednesday MSN, CNM ?Cherre Rob[facility], [facility] ? ? ?

## 2021-03-27 NOTE — MAU Note (Signed)
Pt discharged to home-voices understanding that she is not currently in labor. Reviewed labor precautions and encouraged her to return to MAU as needed. Pt will call office Monday morning for appointment . POC reviewed with Dr.Eure and J.Emly CNM- ?

## 2021-03-28 ENCOUNTER — Encounter: Payer: Self-pay | Admitting: Obstetrics and Gynecology

## 2021-03-28 DIAGNOSIS — O320XX0 Maternal care for unstable lie, not applicable or unspecified: Secondary | ICD-10-CM | POA: Insufficient documentation

## 2021-03-28 LAB — CULTURE, BETA STREP (GROUP B ONLY): Strep Gp B Culture: POSITIVE — AB

## 2021-03-29 ENCOUNTER — Telehealth: Payer: Self-pay | Admitting: Obstetrics & Gynecology

## 2021-03-29 LAB — CERVICOVAGINAL ANCILLARY ONLY
Chlamydia: NEGATIVE
Comment: NEGATIVE
Comment: NORMAL
Neisseria Gonorrhea: NEGATIVE

## 2021-03-29 NOTE — Telephone Encounter (Signed)
Pt was having contractions over the weekend and was seen at [facility]. Pt was given pain med. Pt is continuing to have contractions every 6 minutes. Pt had an Korea scheduled for 3/22 but our office is saying that pt cancelled [place] appt. + baby movement. Pt has not had any spotting/bleeding or leaking fluid. Pt thinks she has lost mucus plug. Pt is concerned that she is not having weekly Korea like she is supposed to be having. Pt was advised if she starts bleeding or leaking fluid or if contractions get closer together and stronger, go to [facility]. Please advise. Thanks!! JSY ?

## 2021-03-29 NOTE — Telephone Encounter (Signed)
Patient called stating that she was told last week when she went to the [facility] that someone from here would call her back about her appointments, patient also upset about her [place] being cancelled for the 22nd. And I tired to explain that she is the one that called last mouth to cancel and she states that she didn't. But is worried that she doesn't have appointment until Friday and wants to know if she needs to be seen before Friday.  ?

## 2021-03-31 ENCOUNTER — Other Ambulatory Visit: Payer: Medicaid Other

## 2021-03-31 ENCOUNTER — Encounter: Payer: Self-pay | Admitting: Obstetrics & Gynecology

## 2021-03-31 ENCOUNTER — Encounter: Payer: Medicaid Other | Admitting: Obstetrics & Gynecology

## 2021-03-31 ENCOUNTER — Other Ambulatory Visit: Payer: Self-pay | Admitting: *Deleted

## 2021-03-31 DIAGNOSIS — O0993 Supervision of high risk pregnancy, unspecified, third trimester: Secondary | ICD-10-CM

## 2021-03-31 DIAGNOSIS — O30043 Twin pregnancy, dichorionic/diamniotic, third trimester: Secondary | ICD-10-CM

## 2021-04-02 ENCOUNTER — Other Ambulatory Visit: Payer: Self-pay

## 2021-04-02 ENCOUNTER — Ambulatory Visit (INDEPENDENT_AMBULATORY_CARE_PROVIDER_SITE_OTHER): Payer: Medicaid Other | Admitting: *Deleted

## 2021-04-02 DIAGNOSIS — O30043 Twin pregnancy, dichorionic/diamniotic, third trimester: Secondary | ICD-10-CM | POA: Diagnosis not present

## 2021-04-02 DIAGNOSIS — O0993 Supervision of high risk pregnancy, unspecified, third trimester: Secondary | ICD-10-CM | POA: Diagnosis not present

## 2021-04-02 NOTE — Progress Notes (Signed)
? ?  NURSE VISIT- NST ? ?SUBJECTIVE:  ?Catherine Le is a 26 y.o. [phone] female at [redacted]w[redacted]d, here for a NST for pregnancy complicated by Multiple gestation.  She reports active fetal movement, contractions: "all day", vaginal bleeding: none, membranes: intact.  ? ?OBJECTIVE:  ?BP 109/76   Pulse (!) 104   LMP 07/17/2020   ?Appears well, no apparent distress ? ?No results found for this or any previous visit (from the past 24 hour(s)). ? ?NST: FHR A baseline 130's bpm, Variability: moderate, Accelerations:present, Decelerations:  Absent= Cat 1/reactive ?Toco: none ?FHR B baseline 135 bpm, Variability:moderate, Accels:present, Decelerations:absent=Cat 1/reactive ? ?ASSESSMENT: ?[id number] at [redacted]w[redacted]d with Multiple gestation ?NST reactive ?Unable to locate tracing once patient left. Both babies noted to be very active during monitoring. ?Patient requesting Dr Despina Hidden be notified that she is "miserable" and wants to be delivered today.  Discussed with patient that Dr Despina Hidden was off and could not be contacted. Would be back in the office on Monday.  Patient not wanting to drive to [place] for her visit so mychart visit made. Discussed indication for delivery for DI/DI twins was 38weeks and c/s would be scheduled at her visit on Monday. Advised if her water broke or started having bleeding, to go to MAU.   ? ?PLAN: ?EFM strip reviewed by Dr. Charlotta Newton   ?Recommendations: keep next appointment as scheduled   ? ?Debbe Odea Tijana Walder  ?04/02/2021 ?1:41 PM ? ?

## 2021-04-05 ENCOUNTER — Telehealth (INDEPENDENT_AMBULATORY_CARE_PROVIDER_SITE_OTHER): Payer: Medicaid Other | Admitting: Obstetrics & Gynecology

## 2021-04-05 ENCOUNTER — Encounter: Payer: Self-pay | Admitting: Obstetrics & Gynecology

## 2021-04-05 ENCOUNTER — Ambulatory Visit: Payer: Medicaid Other | Admitting: *Deleted

## 2021-04-05 ENCOUNTER — Ambulatory Visit: Payer: Medicaid Other | Attending: Obstetrics & Gynecology

## 2021-04-05 ENCOUNTER — Other Ambulatory Visit: Payer: Self-pay

## 2021-04-05 ENCOUNTER — Encounter: Payer: Self-pay | Admitting: *Deleted

## 2021-04-05 VITALS — BP 120/77 | HR 109

## 2021-04-05 DIAGNOSIS — O0993 Supervision of high risk pregnancy, unspecified, third trimester: Secondary | ICD-10-CM

## 2021-04-05 DIAGNOSIS — E669 Obesity, unspecified: Secondary | ICD-10-CM

## 2021-04-05 DIAGNOSIS — O99213 Obesity complicating pregnancy, third trimester: Secondary | ICD-10-CM

## 2021-04-05 DIAGNOSIS — O321XX1 Maternal care for breech presentation, fetus 1: Secondary | ICD-10-CM

## 2021-04-05 DIAGNOSIS — O30043 Twin pregnancy, dichorionic/diamniotic, third trimester: Secondary | ICD-10-CM

## 2021-04-05 DIAGNOSIS — Z3A37 37 weeks gestation of pregnancy: Secondary | ICD-10-CM | POA: Diagnosis not present

## 2021-04-05 NOTE — Patient Instructions (Signed)
Teaghan N Fornwalt ? 04/05/2021 ? ? Your procedure is scheduled on:  04/09/2021 ? Arrive at [facility] [place] on [address] at [facility]  and [address]. You are invited to use the FREE valet parking or use the Visitor's parking deck. ? Pick up the phone at the desk and dial [phone]. ? Call this number if you have problems the morning of surgery: [phone] ? Remember: ? ? Do not eat food:(After Midnight) Desp?s de medianoche. ? Do not drink clear liquids: (After Midnight) Desp?s de medianoche. ? Take these medicines the morning of surgery with A SIP OF WATER:  none ? ? Do not wear jewelry, make-up or nail polish. ? Do not wear lotions, powders, or perfumes. Do not wear deodorant. ? Do not shave 48 hours prior to surgery. ? Do not bring valuables to the hospital.  [facility] is not  ? responsible for any belongings or valuables brought to the hospital. ? Contacts, dentures or bridgework may not be worn into surgery. ? Leave suitcase in the car. After surgery it may be brought to your room. ? For patients admitted to the hospital, checkout time is 11:00 AM the day of  ?            discharge. ? ?   ? Please read over the following fact sheets that you were given:  ?   Preparing for Surgery ? ? ?

## 2021-04-05 NOTE — Progress Notes (Signed)
? ?HIGH-RISK PREGNANCY VISIT ?Patient name: Catherine Le MRN [id number]  Date of birth: 1995/07/13 ?Chief Complaint:   ?Routine Prenatal Visit ?MyChart video visit ?Pt is in her car ?I am in my office ?Total time 10 minutes ?History of Present Illness:   ?Catherine Le is a 26 y.o. [phone] female at [redacted]w[redacted]d with an Estimated Date of Delivery: 04/23/21 being seen today for ongoing management of a high-risk pregnancy complicated by twins pregnancy DCDA.   ? ?Today she reports no complaints. Contractions: Irritability. Vag. Bleeding: None.  Movement: Present. denies leaking of fluid.  ? ? ?  02/04/2021  ?  9:03 AM 10/06/2020  ?  2:54 PM  ?Depression screen PHQ 2/9  ?Decreased Interest 0 2  ?Down, Depressed, Hopeless 0 1  ?PHQ - 2 Score 0 3  ?Altered sleeping 2 3  ?Tired, decreased energy 2 3  ?Change in appetite 0 1  ?Feeling bad or failure about yourself  0 0  ?Trouble concentrating 0 0  ?Moving slowly or fidgety/restless 0 0  ?Suicidal thoughts 0 0  ?PHQ-9 Score 4 10  ? ?  ? ?  02/04/2021  ?  9:03 AM 10/06/2020  ?  2:54 PM  ?GAD 7 : Generalized Anxiety Score  ?Nervous, Anxious, on Edge 0 1  ?Control/stop worrying 0 0  ?Worry too much - different things 1 0  ?Trouble relaxing 0 0  ?Restless 0 0  ?Easily annoyed or irritable 2 2  ?Afraid - awful might happen 0 0  ?Total GAD 7 Score 3 3  ? ? ? ?Review of Systems:   ?Pertinent items are noted in HPI ?Denies abnormal vaginal discharge w/ itching/odor/irritation, headaches, visual changes, shortness of breath, chest pain, abdominal pain, severe nausea/vomiting, or problems with urination or bowel movements unless otherwise stated above. ?Pertinent History Reviewed:  ?Reviewed past medical,surgical, social, obstetrical and family history.  ?Reviewed problem list, medications and allergies. ?Physical Assessment:  ?There were no vitals filed for this visit.There is no height or weight on file to calculate BMI. ?     ?     Physical Examination:  ? General appearance: alert,  well appearing, and in no distress ? Mental status: alert, oriented to person, place, and time ? Skin: warm & dry  ? Extremities: Edema: Trace  ?  Cardiovascular: normal heart rate noted ? Respiratory: normal respiratory effort, no distress ? Abdomen: gravid, soft, non-tender ? Pelvic: Cervical exam deferred        ? ?Fetal Status:     Movement: Present   ? ?Fetal Surveillance Testing today: n/a  ? ?Chaperone: N/A   ? ?Results for orders placed or performed during the hospital encounter of 04/07/21 (from the past 24 hour(s))  ?Type and screen  ? Collection Time: 04/07/21 10:47 AM  ?Result Value Ref Range  ? ABO/RH(D) A POS   ? Antibody Screen NEG   ? Sample Expiration    ?  04/10/2021,2359 ?Performed at [facility], [place] [address], [place], [place] [address] ?  ?CBC  ? Collection Time: 04/07/21 11:00 AM  ?Result Value Ref Range  ? WBC 11.9 (H) 4.0 - 10.5 K/uL  ? RBC 3.74 (L) 3.87 - 5.11 MIL/uL  ? Hemoglobin 9.5 (L) 12.0 - 15.0 g/dL  ? HCT 30.1 (L) 36.0 - 46.0 %  ? MCV 80.5 80.0 - 100.0 fL  ? MCH 25.4 (L) 26.0 - 34.0 pg  ? MCHC 31.6 30.0 - 36.0 g/dL  ? RDW 16.7 (H) 11.5 -  15.5 %  ? Platelets 290 150 - 400 K/uL  ? nRBC 0.0 0.0 - 0.2 %  ?  ?Assessment & Plan:  ?High-risk pregnancy: [id number] at [redacted]w[redacted]d with an Estimated Date of Delivery: 04/23/21  ? ?1) DCDA twins, twin A is breech-->scheduled C section 04/09/21,  ? ? ? ?Meds: No orders of the defined types were placed in this encounter. ? ? ?Labs/procedures today:  ? ?Treatment Plan:  planned C section 04/09/21 ? ? ?Follow-up: No follow-ups on file. ? ? ?No future appointments. ? ?No orders of the defined types were placed in this encounter. ? ?Florian Buff  ?04/07/2021 ?7:03 PM ? ?

## 2021-04-06 ENCOUNTER — Telehealth (HOSPITAL_COMMUNITY): Payer: Self-pay | Admitting: *Deleted

## 2021-04-06 ENCOUNTER — Encounter (HOSPITAL_COMMUNITY): Payer: Self-pay

## 2021-04-06 NOTE — Telephone Encounter (Signed)
Preadmission screen  

## 2021-04-07 ENCOUNTER — Other Ambulatory Visit: Payer: Medicaid Other

## 2021-04-07 ENCOUNTER — Other Ambulatory Visit (HOSPITAL_COMMUNITY)
Admission: RE | Admit: 2021-04-07 | Discharge: 2021-04-07 | Disposition: A | Discharge status: Home / Self Care | Payer: Medicaid Other | Source: Ambulatory Visit | Attending: Obstetrics and Gynecology | Admitting: Obstetrics and Gynecology

## 2021-04-07 ENCOUNTER — Other Ambulatory Visit: Payer: Self-pay

## 2021-04-07 ENCOUNTER — Encounter: Payer: Medicaid Other | Admitting: Women's Health

## 2021-04-07 ENCOUNTER — Other Ambulatory Visit: Payer: Self-pay | Admitting: Obstetrics and Gynecology

## 2021-04-07 ENCOUNTER — Encounter (HOSPITAL_COMMUNITY): Payer: Self-pay

## 2021-04-07 ENCOUNTER — Encounter: Payer: Self-pay | Admitting: Obstetrics & Gynecology

## 2021-04-07 DIAGNOSIS — Z3A00 Weeks of gestation of pregnancy not specified: Secondary | ICD-10-CM | POA: Insufficient documentation

## 2021-04-07 DIAGNOSIS — O30043 Twin pregnancy, dichorionic/diamniotic, third trimester: Secondary | ICD-10-CM | POA: Insufficient documentation

## 2021-04-07 HISTORY — DX: Personal history of other mental and behavioral disorders: Z86.59

## 2021-04-07 LAB — TYPE AND SCREEN
ABO/RH(D): A POS
Antibody Screen: NEGATIVE

## 2021-04-07 LAB — CBC
HCT: 30.1 % — ABNORMAL LOW (ref 36.0–46.0)
Hemoglobin: 9.5 g/dL — ABNORMAL LOW (ref 12.0–15.0)
MCH: 25.4 pg — ABNORMAL LOW (ref 26.0–34.0)
MCHC: 31.6 g/dL (ref 30.0–36.0)
MCV: 80.5 fL (ref 80.0–100.0)
Platelets: 290 10*3/uL (ref 150–400)
RBC: 3.74 MIL/uL — ABNORMAL LOW (ref 3.87–5.11)
RDW: 16.7 % — ABNORMAL HIGH (ref 11.5–15.5)
WBC: 11.9 10*3/uL — ABNORMAL HIGH (ref 4.0–10.5)
nRBC: 0 % (ref 0.0–0.2)

## 2021-04-08 LAB — RPR: RPR Ser Ql: NONREACTIVE

## 2021-04-08 NOTE — Anesthesia Preprocedure Evaluation (Addendum)
Anesthesia Evaluation  ?Patient identified by MRN, date of birth, ID band ?Patient awake ? ? ? ?Reviewed: ?Allergy & Precautions, NPO status , Patient's Chart, lab work & pertinent test results ? ?History of Anesthesia Complications ?Negative for: history of anesthetic complications ? ?Airway ?Mallampati: III ? ?TM Distance: >3 FB ?Neck ROM: Full ? ? ? Dental ?no notable dental hx. ?(+) Dental Advisory Given ?  ?Pulmonary ?neg pulmonary ROS,  ?  ?Pulmonary exam normal ? ? ? ? ? ? ? Cardiovascular ?negative cardio ROS ?Normal cardiovascular exam ? ? ?  ?Neuro/Psych ?negative neurological ROS ?   ? GI/Hepatic ?negative GI ROS, Neg liver ROS,   ?Endo/Other  ?Morbid obesity ? Renal/GU ?negative Renal ROS  ? ?  ?Musculoskeletal ?negative musculoskeletal ROS ?(+)  ? Abdominal ?  ?Peds ? Hematology ? ?(+) Blood dyscrasia, anemia ,   ?Anesthesia Other Findings ? ? Reproductive/Obstetrics ?(+) Pregnancy ? ?  ? ? ? ? ? ? ? ? ? ? ? ? ? ?  ?  ? ? ? ? ? ? ? ?Anesthesia Physical ?Anesthesia Plan ? ?ASA: 3 ? ?Anesthesia Plan: Spinal  ? ?Post-op Pain Management: Tylenol PO (pre-op)*  ? ?Induction:  ? ?PONV Risk Score and Plan: 3 and Ondansetron and Dexamethasone ? ?Airway Management Planned: Simple Face Mask ? ?Additional Equipment:  ? ?Intra-op Plan:  ? ?Post-operative Plan:  ? ?Informed Consent: I have reviewed the patients History and Physical, chart, labs and discussed the procedure including the risks, benefits and alternatives for the proposed anesthesia with the patient or authorized representative who has indicated his/her understanding and acceptance.  ? ? ? ?Dental advisory given ? ?Plan Discussed with: Anesthesiologist, CRNA and Surgeon ? ?Anesthesia Plan Comments:   ? ? ? ? ? ?Anesthesia Quick Evaluation ? ?

## 2021-04-09 ENCOUNTER — Encounter (HOSPITAL_COMMUNITY)
Admission: RE | Disposition: A | Discharge status: Home / Self Care | Payer: Self-pay | Source: Home / Self Care | Attending: Obstetrics and Gynecology

## 2021-04-09 ENCOUNTER — Inpatient Hospital Stay (HOSPITAL_COMMUNITY): Payer: Medicaid Other | Admitting: Anesthesiology

## 2021-04-09 ENCOUNTER — Inpatient Hospital Stay (HOSPITAL_COMMUNITY)
Admission: RE | Admit: 2021-04-09 | Discharge: 2021-04-12 | DRG: 785 | Disposition: A | Discharge status: Home / Self Care | Payer: Medicaid Other | Attending: Obstetrics and Gynecology | Admitting: Obstetrics and Gynecology

## 2021-04-09 ENCOUNTER — Other Ambulatory Visit: Payer: Self-pay

## 2021-04-09 ENCOUNTER — Encounter (HOSPITAL_COMMUNITY): Payer: Self-pay | Admitting: Obstetrics and Gynecology

## 2021-04-09 DIAGNOSIS — O26893 Other specified pregnancy related conditions, third trimester: Secondary | ICD-10-CM | POA: Diagnosis present

## 2021-04-09 DIAGNOSIS — Z3A38 38 weeks gestation of pregnancy: Secondary | ICD-10-CM

## 2021-04-09 DIAGNOSIS — O0990 Supervision of high risk pregnancy, unspecified, unspecified trimester: Secondary | ICD-10-CM

## 2021-04-09 DIAGNOSIS — O99214 Obesity complicating childbirth: Secondary | ICD-10-CM | POA: Diagnosis present

## 2021-04-09 DIAGNOSIS — Z302 Encounter for sterilization: Secondary | ICD-10-CM

## 2021-04-09 DIAGNOSIS — O30049 Twin pregnancy, dichorionic/diamniotic, unspecified trimester: Secondary | ICD-10-CM

## 2021-04-09 DIAGNOSIS — Z98891 History of uterine scar from previous surgery: Secondary | ICD-10-CM

## 2021-04-09 DIAGNOSIS — O99893 Other specified diseases and conditions complicating puerperium: Secondary | ICD-10-CM | POA: Diagnosis not present

## 2021-04-09 DIAGNOSIS — O30043 Twin pregnancy, dichorionic/diamniotic, third trimester: Secondary | ICD-10-CM | POA: Diagnosis present

## 2021-04-09 DIAGNOSIS — O321XX1 Maternal care for breech presentation, fetus 1: Principal | ICD-10-CM | POA: Diagnosis present

## 2021-04-09 DIAGNOSIS — R030 Elevated blood-pressure reading, without diagnosis of hypertension: Secondary | ICD-10-CM | POA: Diagnosis not present

## 2021-04-09 DIAGNOSIS — O0993 Supervision of high risk pregnancy, unspecified, third trimester: Principal | ICD-10-CM

## 2021-04-09 LAB — CBC
HCT: 24.5 % — ABNORMAL LOW (ref 36.0–46.0)
Hemoglobin: 7.5 g/dL — ABNORMAL LOW (ref 12.0–15.0)
MCH: 24.9 pg — ABNORMAL LOW (ref 26.0–34.0)
MCHC: 30.6 g/dL (ref 30.0–36.0)
MCV: 81.4 fL (ref 80.0–100.0)
Platelets: 252 10*3/uL (ref 150–400)
RBC: 3.01 MIL/uL — ABNORMAL LOW (ref 3.87–5.11)
RDW: 16.8 % — ABNORMAL HIGH (ref 11.5–15.5)
WBC: 19.9 10*3/uL — ABNORMAL HIGH (ref 4.0–10.5)
nRBC: 0 % (ref 0.0–0.2)

## 2021-04-09 LAB — COMPREHENSIVE METABOLIC PANEL
ALT: 14 U/L (ref 0–44)
AST: 30 U/L (ref 15–41)
Albumin: 2.7 g/dL — ABNORMAL LOW (ref 3.5–5.0)
Alkaline Phosphatase: 107 U/L (ref 38–126)
Anion gap: 7 (ref 5–15)
BUN: 6 mg/dL (ref 6–20)
CO2: 20 mmol/L — ABNORMAL LOW (ref 22–32)
Calcium: 8.6 mg/dL — ABNORMAL LOW (ref 8.9–10.3)
Chloride: 107 mmol/L (ref 98–111)
Creatinine, Ser: 0.54 mg/dL (ref 0.44–1.00)
GFR, Estimated: >60 mL/min (ref >60)
Glucose, Bld: 87 mg/dL (ref 70–99)
Potassium: 4.4 mmol/L (ref 3.5–5.1)
Sodium: 134 mmol/L — ABNORMAL LOW (ref 135–145)
Total Bilirubin: 0.5 mg/dL (ref 0.3–1.2)
Total Protein: 5.6 g/dL — ABNORMAL LOW (ref 6.5–8.1)

## 2021-04-09 LAB — CREATININE, SERUM
Creatinine, Ser: 0.53 mg/dL (ref 0.44–1.00)
GFR, Estimated: >60 mL/min (ref >60)

## 2021-04-09 SURGERY — Surgical Case
Anesthesia: General | Laterality: Bilateral | Wound class: Clean Contaminated

## 2021-04-09 MED ORDER — SCOPOLAMINE 1 MG/3DAYS TD PT72
MEDICATED_PATCH | TRANSDERMAL | Status: AC
Start: 2021-04-09 — End: ?
  Filled 2021-04-09: qty 1

## 2021-04-09 MED ORDER — SIMETHICONE 80 MG PO CHEW
80.0000 mg | CHEWABLE_TABLET | Freq: Three times a day (TID) | ORAL | Status: DC
  Administered 2021-04-09 – 2021-04-12 (×9): 80 mg via ORAL
  Filled 2021-04-09 (×9): qty 1

## 2021-04-09 MED ORDER — MENTHOL 3 MG MT LOZG
1.0000 | LOZENGE | OROMUCOSAL | Status: DC | PRN
  Administered 2021-04-09: 3 mg via ORAL
  Filled 2021-04-09: qty 9

## 2021-04-09 MED ORDER — PROPOFOL 10 MG/ML IV BOLUS
INTRAVENOUS | Status: AC
  Filled 2021-04-09: qty 40

## 2021-04-09 MED ORDER — FENTANYL CITRATE (PF) 100 MCG/2ML IJ SOLN
INTRAMUSCULAR | Status: DC | PRN
  Administered 2021-04-09 (×2): 100 ug via INTRAVENOUS
  Administered 2021-04-09 (×2): 50 ug via INTRAVENOUS

## 2021-04-09 MED ORDER — CEFAZOLIN SODIUM-DEXTROSE 2-4 GM/100ML-% IV SOLN
INTRAVENOUS | Status: AC
  Filled 2021-04-09: qty 100

## 2021-04-09 MED ORDER — PRENATAL MULTIVITAMIN CH
1.0000 | ORAL_TABLET | Freq: Every day | ORAL | Status: DC
  Administered 2021-04-10 – 2021-04-12 (×3): 1 via ORAL
  Filled 2021-04-09 (×3): qty 1

## 2021-04-09 MED ORDER — MIDAZOLAM HCL 2 MG/2ML IJ SOLN
INTRAMUSCULAR | Status: AC
  Filled 2021-04-09: qty 2

## 2021-04-09 MED ORDER — SCOPOLAMINE 1 MG/3DAYS TD PT72
1.0000 | MEDICATED_PATCH | TRANSDERMAL | Status: DC
  Administered 2021-04-09: 1.5 mg via TRANSDERMAL

## 2021-04-09 MED ORDER — FENTANYL CITRATE (PF) 100 MCG/2ML IJ SOLN
INTRAMUSCULAR | Status: AC
  Filled 2021-04-09: qty 2

## 2021-04-09 MED ORDER — ONDANSETRON HCL 4 MG/2ML IJ SOLN
INTRAMUSCULAR | Status: AC
  Filled 2021-04-09: qty 2

## 2021-04-09 MED ORDER — SODIUM CHLORIDE 0.9 % IV SOLN
500.0000 mg | Freq: Once | INTRAVENOUS | Status: AC
  Administered 2021-04-09: 500 mg via INTRAVENOUS
  Filled 2021-04-09: qty 25

## 2021-04-09 MED ORDER — GLYCOPYRROLATE PF 0.2 MG/ML IJ SOSY
PREFILLED_SYRINGE | INTRAMUSCULAR | Status: AC
  Filled 2021-04-09: qty 1

## 2021-04-09 MED ORDER — MEPERIDINE HCL 25 MG/ML IJ SOLN: 6.2500 mg | INTRAMUSCULAR | Status: DC | PRN

## 2021-04-09 MED ORDER — OXYTOCIN-SODIUM CHLORIDE 30-0.9 UT/500ML-% IV SOLN: INTRAVENOUS | Status: DC | PRN

## 2021-04-09 MED ORDER — ALBUMIN HUMAN 5 % IV SOLN: INTRAVENOUS | Status: DC | PRN

## 2021-04-09 MED ORDER — TRANEXAMIC ACID-NACL 1000-0.7 MG/100ML-% IV SOLN: INTRAVENOUS | Status: DC | PRN

## 2021-04-09 MED ORDER — WITCH HAZEL-GLYCERIN EX PADS: 1.0000 "application " | MEDICATED_PAD | CUTANEOUS | Status: DC | PRN

## 2021-04-09 MED ORDER — DIPHENHYDRAMINE HCL 25 MG PO CAPS: 25.0000 mg | ORAL_CAPSULE | Freq: Four times a day (QID) | ORAL | Status: DC | PRN

## 2021-04-09 MED ORDER — DIPHENHYDRAMINE HCL 25 MG PO CAPS: 25.0000 mg | ORAL_CAPSULE | ORAL | Status: DC | PRN

## 2021-04-09 MED ORDER — PROPOFOL 10 MG/ML IV BOLUS
INTRAVENOUS | Status: DC | PRN
  Administered 2021-04-09: 150 mg via INTRAVENOUS

## 2021-04-09 MED ORDER — DEXAMETHASONE SODIUM PHOSPHATE 10 MG/ML IJ SOLN
INTRAMUSCULAR | Status: DC | PRN
  Administered 2021-04-09: 10 mg via INTRAVENOUS

## 2021-04-09 MED ORDER — LACTATED RINGERS IV SOLN: INTRAVENOUS | Status: DC

## 2021-04-09 MED ORDER — NALOXONE HCL 4 MG/10ML IJ SOLN
1.0000 ug/kg/h | INTRAVENOUS | Status: DC | PRN
  Filled 2021-04-09: qty 5

## 2021-04-09 MED ORDER — PHENYLEPHRINE 40 MCG/ML (10ML) SYRINGE FOR IV PUSH (FOR BLOOD PRESSURE SUPPORT)
PREFILLED_SYRINGE | INTRAVENOUS | Status: AC
  Filled 2021-04-09: qty 10

## 2021-04-09 MED ORDER — SCOPOLAMINE 1 MG/3DAYS TD PT72: 1.0000 | MEDICATED_PATCH | Freq: Once | TRANSDERMAL | Status: DC

## 2021-04-09 MED ORDER — METHYLERGONOVINE MALEATE 0.2 MG/ML IJ SOLN
INTRAMUSCULAR | Status: DC | PRN
  Administered 2021-04-09: .2 mg via INTRAMUSCULAR

## 2021-04-09 MED ORDER — TETANUS-DIPHTH-ACELL PERTUSSIS 5-2.5-18.5 LF-MCG/0.5 IM SUSY: 0.5000 mL | PREFILLED_SYRINGE | Freq: Once | INTRAMUSCULAR | Status: DC

## 2021-04-09 MED ORDER — CEFAZOLIN SODIUM 10 G IJ SOLR
INTRAMUSCULAR | Status: DC | PRN
  Administered 2021-04-09: 3 g via INTRAVENOUS

## 2021-04-09 MED ORDER — DIBUCAINE (PERIANAL) 1 % EX OINT
1.0000 | TOPICAL_OINTMENT | CUTANEOUS | Status: DC | PRN
Start: 2021-04-09 — End: 2021-04-12

## 2021-04-09 MED ORDER — KETOROLAC TROMETHAMINE 30 MG/ML IJ SOLN
30.0000 mg | Freq: Four times a day (QID) | INTRAMUSCULAR | Status: AC
  Administered 2021-04-09 – 2021-04-10 (×4): 30 mg via INTRAVENOUS
  Filled 2021-04-09 (×4): qty 1

## 2021-04-09 MED ORDER — SENNOSIDES-DOCUSATE SODIUM 8.6-50 MG PO TABS
2.0000 | ORAL_TABLET | Freq: Every day | ORAL | Status: DC
  Administered 2021-04-10 – 2021-04-12 (×3): 2 via ORAL
  Filled 2021-04-09 (×3): qty 2

## 2021-04-09 MED ORDER — SODIUM CHLORIDE 0.9 % IR SOLN
Status: DC | PRN
  Administered 2021-04-09: 1

## 2021-04-09 MED ORDER — ACETAMINOPHEN 500 MG PO TABS
1000.0000 mg | ORAL_TABLET | Freq: Once | ORAL | Status: AC
  Administered 2021-04-09: 1000 mg via ORAL

## 2021-04-09 MED ORDER — SUCCINYLCHOLINE CHLORIDE 200 MG/10ML IV SOSY
PREFILLED_SYRINGE | INTRAVENOUS | Status: DC | PRN
  Administered 2021-04-09: 200 mg via INTRAVENOUS

## 2021-04-09 MED ORDER — ONDANSETRON HCL 4 MG/2ML IJ SOLN
INTRAMUSCULAR | Status: DC | PRN
  Administered 2021-04-09: 4 mg via INTRAVENOUS

## 2021-04-09 MED ORDER — COCONUT OIL OIL
1.0000 "application " | TOPICAL_OIL | Status: DC | PRN
  Administered 2021-04-10: 1 via TOPICAL

## 2021-04-09 MED ORDER — CEFAZOLIN IN SODIUM CHLORIDE 3-0.9 GM/100ML-% IV SOLN
INTRAVENOUS | Status: AC
  Filled 2021-04-09: qty 100

## 2021-04-09 MED ORDER — ALBUMIN HUMAN 5 % IV SOLN
INTRAVENOUS | Status: AC
  Filled 2021-04-09: qty 250

## 2021-04-09 MED ORDER — ONDANSETRON HCL 4 MG/2ML IJ SOLN: 4.0000 mg | Freq: Three times a day (TID) | INTRAMUSCULAR | Status: DC | PRN

## 2021-04-09 MED ORDER — NALOXONE HCL 0.4 MG/ML IJ SOLN: 0.4000 mg | INTRAMUSCULAR | Status: DC | PRN

## 2021-04-09 MED ORDER — SOD CITRATE-CITRIC ACID 500-334 MG/5ML PO SOLN
ORAL | Status: AC
  Filled 2021-04-09: qty 30

## 2021-04-09 MED ORDER — ACETAMINOPHEN 500 MG PO TABS
1000.0000 mg | ORAL_TABLET | Freq: Four times a day (QID) | ORAL | Status: DC
  Administered 2021-04-09 – 2021-04-12 (×12): 1000 mg via ORAL
  Filled 2021-04-09 (×14): qty 2

## 2021-04-09 MED ORDER — TRANEXAMIC ACID-NACL 1000-0.7 MG/100ML-% IV SOLN
INTRAVENOUS | Status: AC
  Filled 2021-04-09: qty 100

## 2021-04-09 MED ORDER — PHENYLEPHRINE HCL-NACL 20-0.9 MG/250ML-% IV SOLN
INTRAVENOUS | Status: AC
Start: 2021-04-09 — End: ?
  Filled 2021-04-09: qty 250

## 2021-04-09 MED ORDER — MORPHINE SULFATE (PF) 0.5 MG/ML IJ SOLN
INTRAMUSCULAR | Status: AC
  Filled 2021-04-09: qty 10

## 2021-04-09 MED ORDER — LIDOCAINE HCL (CARDIAC) PF 100 MG/5ML IV SOSY
PREFILLED_SYRINGE | INTRAVENOUS | Status: DC | PRN
Start: 2021-04-09 — End: 2021-04-09
  Administered 2021-04-09: 100 mg via INTRAVENOUS

## 2021-04-09 MED ORDER — OXYCODONE HCL 5 MG PO TABS
5.0000 mg | ORAL_TABLET | ORAL | Status: DC | PRN
  Administered 2021-04-10 – 2021-04-11 (×3): 5 mg via ORAL
  Filled 2021-04-09 (×4): qty 1

## 2021-04-09 MED ORDER — ENOXAPARIN SODIUM 60 MG/0.6ML IJ SOSY
60.0000 mg | PREFILLED_SYRINGE | INTRAMUSCULAR | Status: DC
  Filled 2021-04-09 (×2): qty 0.6

## 2021-04-09 MED ORDER — FENTANYL CITRATE (PF) 100 MCG/2ML IJ SOLN
25.0000 ug | INTRAMUSCULAR | Status: DC | PRN
  Administered 2021-04-09 (×2): 50 ug via INTRAVENOUS

## 2021-04-09 MED ORDER — LIDOCAINE 2% (20 MG/ML) 5 ML SYRINGE
INTRAMUSCULAR | Status: AC
  Filled 2021-04-09: qty 5

## 2021-04-09 MED ORDER — STERILE WATER FOR IRRIGATION IR SOLN
Status: DC | PRN
  Administered 2021-04-09: 1000 mL

## 2021-04-09 MED ORDER — MIDAZOLAM BOLUS VIA INFUSION: 2.0000 mg | Freq: Once | INTRAVENOUS | Status: DC

## 2021-04-09 MED ORDER — CELECOXIB 200 MG PO CAPS
ORAL_CAPSULE | ORAL | Status: AC
  Filled 2021-04-09: qty 1

## 2021-04-09 MED ORDER — CELECOXIB 200 MG PO CAPS
200.0000 mg | ORAL_CAPSULE | Freq: Once | ORAL | Status: AC
  Administered 2021-04-09: 200 mg via ORAL

## 2021-04-09 MED ORDER — NIFEDIPINE ER OSMOTIC RELEASE 30 MG PO TB24
30.0000 mg | ORAL_TABLET | Freq: Every day | ORAL | Status: DC
  Administered 2021-04-09 – 2021-04-10 (×2): 30 mg via ORAL
  Filled 2021-04-09 (×2): qty 1

## 2021-04-09 MED ORDER — CEFAZOLIN SODIUM-DEXTROSE 2-4 GM/100ML-% IV SOLN: 2.0000 g | INTRAVENOUS | Status: DC

## 2021-04-09 MED ORDER — DEXMEDETOMIDINE (PRECEDEX) IN NS 20 MCG/5ML (4 MCG/ML) IV SYRINGE
PREFILLED_SYRINGE | INTRAVENOUS | Status: AC
  Filled 2021-04-09: qty 5

## 2021-04-09 MED ORDER — OXYTOCIN-SODIUM CHLORIDE 30-0.9 UT/500ML-% IV SOLN
INTRAVENOUS | Status: DC | PRN
Start: 2021-04-09 — End: 2021-04-09
  Administered 2021-04-09 (×2): 30 [IU] via INTRAVENOUS

## 2021-04-09 MED ORDER — OXYTOCIN-SODIUM CHLORIDE 30-0.9 UT/500ML-% IV SOLN: 2.5000 [IU]/h | INTRAVENOUS | Status: AC

## 2021-04-09 MED ORDER — FENTANYL CITRATE (PF) 250 MCG/5ML IJ SOLN
INTRAMUSCULAR | Status: AC
  Filled 2021-04-09: qty 5

## 2021-04-09 MED ORDER — DEXAMETHASONE SODIUM PHOSPHATE 10 MG/ML IJ SOLN
INTRAMUSCULAR | Status: AC
  Filled 2021-04-09: qty 1

## 2021-04-09 MED ORDER — TRANEXAMIC ACID-NACL 1000-0.7 MG/100ML-% IV SOLN
INTRAVENOUS | Status: DC | PRN
  Administered 2021-04-09: 1000 mg via INTRAVENOUS

## 2021-04-09 MED ORDER — SODIUM CHLORIDE 0.9% FLUSH: 3.0000 mL | INTRAVENOUS | Status: DC | PRN

## 2021-04-09 MED ORDER — POVIDONE-IODINE 10 % EX SWAB
2.0000 "application " | Freq: Once | CUTANEOUS | Status: AC
  Administered 2021-04-09: 2 via TOPICAL

## 2021-04-09 MED ORDER — METHYLERGONOVINE MALEATE 0.2 MG/ML IJ SOLN
INTRAMUSCULAR | Status: AC
  Filled 2021-04-09: qty 1

## 2021-04-09 MED ORDER — OXYTOCIN-SODIUM CHLORIDE 30-0.9 UT/500ML-% IV SOLN
INTRAVENOUS | Status: AC
  Filled 2021-04-09: qty 500

## 2021-04-09 MED ORDER — DIPHENHYDRAMINE HCL 50 MG/ML IJ SOLN: 12.5000 mg | INTRAMUSCULAR | Status: DC | PRN

## 2021-04-09 MED ORDER — ACETAMINOPHEN 500 MG PO TABS
ORAL_TABLET | ORAL | Status: AC
  Filled 2021-04-09: qty 2

## 2021-04-09 MED ORDER — SODIUM CHLORIDE 0.9 % IV SOLN: INTRAVENOUS | Status: DC | PRN

## 2021-04-09 MED ORDER — SUCCINYLCHOLINE CHLORIDE 200 MG/10ML IV SOSY
PREFILLED_SYRINGE | INTRAVENOUS | Status: AC
  Filled 2021-04-09: qty 10

## 2021-04-09 MED ORDER — PHENYLEPHRINE HCL-NACL 20-0.9 MG/250ML-% IV SOLN
INTRAVENOUS | Status: DC | PRN
  Administered 2021-04-09: 60 ug/min via INTRAVENOUS

## 2021-04-09 MED ORDER — SIMETHICONE 80 MG PO CHEW
80.0000 mg | CHEWABLE_TABLET | ORAL | Status: DC | PRN
  Administered 2021-04-10: 80 mg via ORAL
  Filled 2021-04-09: qty 1

## 2021-04-09 MED ORDER — SOD CITRATE-CITRIC ACID 500-334 MG/5ML PO SOLN
30.0000 mL | ORAL | Status: AC
  Administered 2021-04-09: 30 mL via ORAL

## 2021-04-09 MED ORDER — MIDAZOLAM HCL 2 MG/2ML IJ SOLN
2.0000 mg | Freq: Once | INTRAMUSCULAR | Status: AC
  Administered 2021-04-09: 2 mg via INTRAVENOUS

## 2021-04-09 MED ORDER — IBUPROFEN 600 MG PO TABS
600.0000 mg | ORAL_TABLET | Freq: Four times a day (QID) | ORAL | Status: DC
  Administered 2021-04-10 – 2021-04-12 (×10): 600 mg via ORAL
  Filled 2021-04-09 (×10): qty 1

## 2021-04-09 SURGICAL SUPPLY — 40 items
ADH SKN CLS APL DERMABOND .7 (GAUZE/BANDAGES/DRESSINGS) ×2
CLAMP CORD UMBIL (MISCELLANEOUS) IMPLANT
CLOTH BEACON ORANGE TIMEOUT ST (SAFETY) ×3 IMPLANT
DERMABOND ADVANCED (GAUZE/BANDAGES/DRESSINGS) ×2
DERMABOND ADVANCED .7 DNX12 (GAUZE/BANDAGES/DRESSINGS) ×4 IMPLANT
DRSG OPSITE POSTOP 4X10 (GAUZE/BANDAGES/DRESSINGS) ×3 IMPLANT
ELECT REM PT RETURN 9FT ADLT (ELECTROSURGICAL) ×2
ELECTRODE REM PT RTRN 9FT ADLT (ELECTROSURGICAL) ×2 IMPLANT
EXTRACTOR VACUUM BELL STYLE (SUCTIONS) IMPLANT
GLOVE BIOGEL PI IND STRL 7.0 (GLOVE) ×2 IMPLANT
GLOVE BIOGEL PI IND STRL 8 (GLOVE) ×2 IMPLANT
GLOVE BIOGEL PI INDICATOR 7.0 (GLOVE) ×1
GLOVE BIOGEL PI INDICATOR 8 (GLOVE) ×1
GLOVE ECLIPSE 8.0 STRL XLNG CF (GLOVE) ×3 IMPLANT
GOWN STRL REUS W/TWL LRG LVL3 (GOWN DISPOSABLE) ×6 IMPLANT
KIT ABG SYR 3ML LUER SLIP (SYRINGE) ×3 IMPLANT
MAT PREVALON FULL STRYKER (MISCELLANEOUS) ×1 IMPLANT
NDL HYPO 25X5/8 SAFETYGLIDE (NEEDLE) ×2 IMPLANT
NEEDLE HYPO 25X5/8 SAFETYGLIDE (NEEDLE) ×2 IMPLANT
NS IRRIG 1000ML POUR BTL (IV SOLUTION) ×2 IMPLANT
PACK C SECTION WH (CUSTOM PROCEDURE TRAY) ×3 IMPLANT
PAD OB MATERNITY 4.3X12.25 (PERSONAL CARE ITEMS) ×2 IMPLANT
PENCIL SMOKE EVAC W/HOLSTER (ELECTROSURGICAL) ×3 IMPLANT
RETRACTOR TRAXI PANNICULUS (MISCELLANEOUS) IMPLANT
RTRCTR C-SECT PINK 25CM LRG (MISCELLANEOUS) IMPLANT
SPONGE T-LAP 18X18 ~~LOC~~+RFID (SPONGE) ×1 IMPLANT
SUT CHROMIC 0 CT 1 (SUTURE) ×4 IMPLANT
SUT MNCRL 0 VIOLET CTX 36 (SUTURE) ×2 IMPLANT
SUT MONOCRYL 0 CTX 36 (SUTURE) ×4
SUT PLAIN 2 0 (SUTURE) ×4
SUT PLAIN 2 0 XLH (SUTURE) IMPLANT
SUT PLAIN ABS 2-0 CT1 27XMFL (SUTURE) ×2 IMPLANT
SUT VIC AB 0 CTX 36 (SUTURE) ×2
SUT VIC AB 0 CTX36XBRD ANBCTRL (SUTURE) ×2 IMPLANT
SUT VIC AB 4-0 KS 27 (SUTURE) IMPLANT
SYR 20CC LL (SYRINGE) ×6 IMPLANT
TOWEL OR 17X24 6PK STRL BLUE (TOWEL DISPOSABLE) ×3 IMPLANT
TRAXI PANNICULUS RETRACTOR (MISCELLANEOUS) ×1
TRAY FOLEY W/BAG SLVR 14FR LF (SET/KITS/TRAYS/PACK) IMPLANT
WATER STERILE IRR 1000ML POUR (IV SOLUTION) ×3 IMPLANT

## 2021-04-09 NOTE — Anesthesia Procedure Notes (Signed)
Procedure Name: Intubation ?Date/Time: 04/09/2021 8:58 AM ?Performed by: Elgie Congo, CRNA ?Pre-anesthesia Checklist: Patient identified, Emergency Drugs available, Suction available and Patient being monitored ?Patient Re-evaluated:Patient Re-evaluated prior to induction ?Oxygen Delivery Method: Circle system utilized ?Preoxygenation: Pre-oxygenation with 100% oxygen ?Induction Type: IV induction, Cricoid Pressure applied and Rapid sequence ?Laryngoscope Size: Glidescope ?Grade View: Grade II ?Tube type: Oral ?Tube size: 7.0 mm ?Number of attempts: 1 ?Airway Equipment and Method: Oral airway and Rigid stylet ?Placement Confirmation: ETT inserted through vocal cords under direct vision, positive ETCO2 and breath sounds checked- equal and bilateral ?Secured at: 21 cm ?Tube secured with: Tape ?Dental Injury: Teeth and Oropharynx as per pre-operative assessment  ? ? ? ? ?

## 2021-04-09 NOTE — Lactation Note (Addendum)
This note was copied from a baby's chart. ?Lactation Consultation Note ? ?Patient Name: Catherine Le ?Today's Date: 04/09/2021 ?Reason for consult: Follow-up assessment;Mother's request;Early term 37-38.6wks ?Age:26 hours ?LC did not assist with latching [name] at the breast infant was recently given 15 mls of mom's EBM by bottle and was not interested in feeding at this time. ?Per mom, she has lots of EBM that she pumped in her 2nd trimester at home, she brought large bags of  frozen EBM with her to the hospital  ?Mom requested [place] assistance to latch [name] at the breast, per mom, [name] has been sleepy . ?Mom latched [name] on her left breast using the football hold position, infant latched with depth and actively breastfeed for 15 minutes. ?Afterwards [name] was given 20 mls of mom's EBM using a yellow slow flow bottle nipple, infant was pace feed. ?Mom will call for latch assistance for [name] with the next feeding. ?Mom 's plan: ?1- Mom will continue to latch both infant at the breast according to hunger cues, 8 to 12+ times within 24 hours, skin to skin. ?2- Mom will ask RN/LC  for latch assistance if needed. ?3- Mom will continue to supplement twins with her EBM after latching infant's at the breast. ?4- Mom will continue to use DEBP every 3 hours for 15 minutes on initial setting to continue to maintain her milk supply. ?Maternal Data ?Has patient been taught Hand Expression?: Yes ?Does the patient have breastfeeding experience prior to this delivery?: Yes ?How long did the patient breastfeed?: 6 months ? ?Feeding ?Mother's Current Feeding Choice: Breast Milk ?Nipple Type: Slow - flow ? ?LATCH Score ?Latch: Grasps breast easily, tongue down, lips flanged, rhythmical sucking. ? ?Audible Swallowing: Spontaneous and intermittent ? ?Type of Nipple: Everted at rest and after stimulation ? ?Comfort (Breast/Nipple): Soft / non-tender ? ?Hold (Positioning): Assistance needed to correctly position infant at  breast and maintain latch. ? ?LATCH Score: 9 ? ? ?Lactation Tools Discussed/Used ?Tools: Pump;Flanges ?Flange Size: 24 ?Breast pump type: Double-Electric Breast Pump ?Pump Education: Setup, frequency, and cleaning;Milk Storage ?Reason for Pumping: increase stimulation ?Pumping frequency: every 3 hrs for 15 min ? ?Interventions ?Interventions: Breast feeding basics reviewed;Breast compression;Position options;Expressed milk;DEBP;Education;Pace feeding;LC Magazine features editor;Infant Driven Feeding Algorithm education ? ?Discharge ?Pump: DEBP ?[place] Program: Yes ? ?Consult Status ?Consult Status: Follow-up ?Date: 04/10/21 ?Follow-up type: In-patient ? ? ? ?Vicente Serene ?04/09/2021, 4:27 PM ? ? ? ?

## 2021-04-09 NOTE — Discharge Summary (Signed)
? ?  Postpartum Discharge Summary ? ? ? ?   ?Patient Name: Catherine Le ?DOB: [birth year] ?MRN: [id number] ? ?Date of admission: 04/09/2021 ?Delivery date: ?  ?Cambree, Hendrix Tinesha [[id number]]  ?04/09/2021  ?  ?Ala, Kratz Anis [[id number]]  ?04/09/2021  ?Delivering provider:  ?  ?Aarika, Moon Bertie [[id number]]  ?Tania Ade H  ?  ?Eloisa, Chokshi Shalaine [[id number]]  ?Tania Ade H  ?Date of discharge: 04/12/2021 ? ?Admitting diagnosis: Twin pregnancy, twins dichorionic and diamniotic [O30.049] ?Intrauterine pregnancy: [redacted]w[redacted]d    ?Secondary diagnosis:  Principal Problem: ?  Twin pregnancy, twins dichorionic and diamniotic ?Active Problems: ?  Supervision of high-risk pregnancy ?  Dichorionic diamniotic twin gestation ?  H/O cesarean section ? ?Additional problems: None    ?Discharge diagnosis: Term Pregnancy Delivered                                              ?Post partum procedures:   ?Augmentation: N/A ?Complications: None ? ?Hospital course: Sceduled C/S   26y.o. yo [id number] 320w0das admitted to the hospital 04/09/2021 for scheduled cesarean section with the following indication:Malpresentation.Delivery details are as follows:  ?Membrane Rupture Time/Date:  ?  ?HeSamiyah, Stupkanntrise [0[[id number]]?9:00 AM  ?  ?HeTailer, Volkertnntrise [0[[id number]]?9:03 AM , ?  ?HeShalayna, Ornsteinnntrise [0[[id number]]?04/09/2021  ?  ?HeDarielys, Giglianntrise [0[[id number]]?04/09/2021   ?Delivery Method: ?  ?HeKoula, Veniernntrise [0[[id number]]?C-Section, Low Transverse  ?  ?HeMayetta, Castlemannntrise [0[[id number]]?C-Section, Low Transverse  ?Details of operation can be found in separate operative note.  Patient had an uncomplicated postpartum course.  She is ambulating, tolerating a regular diet, passing flatus, and urinating well. Patient is discharged home in stable condition on  04/12/21 ?       ?Newborn Data: ?Birth date: ?  ?HeEvia, Goldsmithnntrise [0[[id number]]?04/09/2021  ?  ?HeRemonia, Ottenntrise [0[[id number]]?04/09/2021   ?Birth time: ?  ?HeLoreda, Silverionntrise [0[[id number]]?9:01 AM  ?  ?HeLondyn, Hotardnntrise [0[[id number]]?9:03 AM  ?Gender: ?  ?HeDominigue, Gellnernntrise [0[[id number]]?Female  ?  ?HeAbisai, Coblenntrise [0[[id number]]?Female  ?Living status: ?  ?HeNatelie, Ostroskynntrise [0[[id number]]?Living  ?  ?HeShley, Dolbynntrise [0[[id number]]?Living  ?Apgars: ?  ?HeDerek, Huneycuttnntrise [0[[id number]]?7  ?  ?HeJlyn, Cerrosnntrise [0[[id number]]  ?6 ?  ?HeSelma, Minknntrise [0[[id number]]?9  ?  ?HeNanako, Stopher0[[id number]]  ?3?Weight: ?  ?HeMerci, Walthersnntrise [0[[id number]]?3300 g  ?  ?HeJaquayla, Hegenntrise [0[[id number]]?2835 g    ? ?Magnesium Sulfate received: No ?BMZ received: No ?Rhophylac:N/A ?MMR:N/A ?T-DaP:Given postpartum ?Flu: N/A ?Transfusion:No ? ?Physical exam  ?Vitals:  ? 04/11/21 0855 04/11/21 1543 04/11/21 1957 04/12/21 0525  ?BP: 100/65 114/67 107/60 110/70  ?Pulse: (!) 101 100  (!) 110  ?Resp:  _0 ?Temp:  98.5 ?F (36.9 ?C) 98.9 ?F (37.2 ?C) 99 ?F (37.2 ?C)  ?TempSrc:  Oral Oral Oral  ?SpO2:      ?Weight:      ?Height:      ? ?General: alert, cooperative, and no distress ?Lochia: appropriate ?Uterine Fundus: firm ?Incision: Healing well with no significant drainage, No significant erythema, Dressing is clean, dry, and intact ?DVT Evaluation:  No evidence of DVT seen on physical exam. ?Negative Homan's sign. ?No cords or calf tenderness. ?No significant calf/ankle edema. ?Labs: ?Lab Results  ?Component Value Date  ? WBC 19.9 (H) 04/09/2021  ? HGB 7.5 (L) 04/09/2021  ? HCT 24.5 (L) 04/09/2021  ? MCV 81.4 04/09/2021  ? PLT 252 04/09/2021  ? ? ?  Latest Ref Rng & Units 04/09/2021  ?  2:36 PM  ?CMP  ?Glucose 70 - 99 mg/dL 87    ?BUN 6 - 20 mg/dL 6    ?Creatinine 0.44 - 1.00 mg/dL 0.54    ?Sodium 135 - 145 mmol/L 134    ?Potassium 3.5 - 5.1 mmol/L 4.4    ?Chloride 98 - 111 mmol/L 107    ?CO2 22 - 32 mmol/L 20    ?Calcium 8.9 - 10.3 mg/dL 8.6    ?Total Protein 6.5 - 8.1 g/dL 5.6    ?Total Bilirubin 0.3 - 1.2  mg/dL 0.5    ?Alkaline Phos 38 - 126 U/L 107    ?AST 15 - 41 U/L 30    ?ALT 0 - 44 U/L 14    ? ?Edinburgh Score: ? ?  04/11/2021  ?  3:56 PM  ?Flavia Shipper Postnatal Depression Scale Screening Tool  ?I have been able to laugh and see the funny side of things. 0  ?I have looked forward with enjoyment to things. 1  ?I have blamed myself unnecessarily when things went wrong. 1  ?I have been anxious or worried for no good reason. 2  ?I have felt scared or panicky for no good reason. 2  ?Things have been getting on top of me. 2  ?I have been so unhappy that I have had difficulty sleeping. 1  ?I have felt sad or miserable. 1  ?I have been so unhappy that I have been crying. 0  ?The thought of harming myself has occurred to me. 0  ?Edinburgh Postnatal Depression Scale Total 10  ? ? ? ?After visit meds:  ?Allergies as of 04/12/2021   ? ?   Reactions  ? Shrimp [shellfish Allergy] Shortness Of Breath  ? ?  ? ?  ?Medication List  ?  ? ?STOP taking these medications   ? ?aspirin 81 MG EC tablet ?  ?Blood Pressure Monitor Misc ?  ?Doxylamine-Pyridoxine 10-10 MG Tbec ?Commonly known as: Diclegis ?  ?omeprazole 20 MG capsule ?Commonly known as: PRILOSEC ?  ?oxyCODONE-acetaminophen 5-325 MG tablet ?Commonly known as: PERCOCET/ROXICET ?  ? ?  ? ?TAKE these medications   ? ?acetaminophen 500 MG tablet ?Commonly known as: TYLENOL ?Take 2 tablets (1,000 mg total) by mouth every 6 (six) hours. ?  ?ferrous sulfate 325 (65 FE) MG tablet ?Take 1 tablet (325 mg total) by mouth every other day. ?What changed: when to take this ?  ?furosemide 20 MG tablet ?Commonly known as: LASIX ?Take 1 tablet (20 mg total) by mouth daily. ?  ?ibuprofen 600 MG tablet ?Commonly known as: ADVIL ?Take 1 tablet (600 mg total) by mouth every 6 (six) hours. ?  ?oxyCODONE 5 MG immediate release tablet ?Commonly known as: Oxy IR/ROXICODONE ?Take 1 tablet (5 mg total) by mouth every 6 (six) hours as needed for severe pain. ?  ?prenatal multivitamin Tabs tablet ?Take 1  tablet by mouth in the morning. ?  ? ?  ? ? ? ?Discharge home in stable condition ?Infant Feeding: Bottle and Breast ?Infant Disposition:home with mother ?Discharge instruction: per After Visit Summary and Postpartum booklet. ?Activity: Advance  as tolerated. Pelvic rest for 6 weeks.  ?Diet: routine diet ?Future Appointments: ?Future Appointments  ?Date Time Provider [place]  ?04/19/2021 11:30 AM Janyth Pupa, DO [facility]  ?05/10/2021 10:10 AM Janyth Pupa, DO [facility]  ? ?Follow up Visit: ?Message sent to [facility] by Dr. Cy Blamer on 3/31 ? ?Please schedule this patient for a In person postpartum visit in 4 weeks with the following provider: MD. ?Additional Postpartum F/U:Incision check 1 week and BP check 1 week  ?High risk pregnancy complicated by:  twin pregnancy ?Delivery mode:   ?  ?Rheannon, Cerney Lucely [[id number]]  ?C-Section, Low Transverse  ?  ?Arien, Morine Oviya [[id number]]  ?C-Section, Low Transverse  ?Anticipated Birth Control:  BTL done PP ? ? ?04/12/2021 ?Christin Fudge, CNM ? ? ? ?

## 2021-04-09 NOTE — Lactation Note (Signed)
This note was copied from a baby's chart. ?Lactation Consultation Note ? ?Patient Name: Catherine Le ?Today's Date: 04/09/2021 ?Reason for consult: Follow-up assessment;Mother's request;Early term 37-38.6wks ?Age:26 years ? ?[name] ?Mom requested latch assistance with [name]. ?LC student assisted mom with latching infant on her right breast using the football hold position, infant latched with depth, and BF for 10 minutes. ?After feeding [name] fell asleep at the breast and mom continued with STS. ?  ?[name] ?[facility] student and mom attempted to latch [name] but [name] was not interested in breast feeding at this time.LC student supplement of mom's EBM. Mom continued doing skin to skin. ? ?Mom Feeding Plan: ?Mom will latch babies on demand or feed babies 8+/24 hrs. If babies are still hungry after feeding at the breast, mom will supplement with her own EBM according to feeding guidelines.  ? ?Maternal Data ?  ? ?Feeding ?Mother's Current Feeding Choice: Breast Milk ? ?LATCH Score ?Latch: Grasps breast easily, tongue down, lips flanged, rhythmical sucking. ? ?Audible Swallowing: Spontaneous and intermittent ? ?Type of Nipple: Everted at rest and after stimulation ? ?Comfort (Breast/Nipple): Soft / non-tender ? ?Hold (Positioning): Assistance needed to correctly position infant at breast and maintain latch. ? ?LATCH Score: 9 ? ? ?Lactation Tools Discussed/Used ?  ? ?Interventions ?Interventions: Skin to skin;Assisted with latch;Adjust position;Support pillows ? ?Discharge ?  ? ?Consult Status ?Consult Status: Follow-up ?Date: 04/10/21 ?Follow-up type: In-patient ? ? ? ?Mccartney Chuba Ladona Ridgel ?04/09/2021, 10:41 PM ? ? ? ?

## 2021-04-09 NOTE — Lactation Note (Addendum)
This note was copied from a baby's chart. ?Lactation Consultation Note ? ?Patient Name: Catherine Le ?Today's Date: 04/09/2021 ?Reason for consult: Initial assessment;Mother's request;Early term 37-38.6wks;Breastfeeding assistance ?Age:26 hours ? ?Mom recently fed infant 15 ml. LC set up DEBP and reviewed pump parts.  ?Mom to call for latch assistance with next feeding.  ? ?[name] latched for 10 min and took 15 ml ?[name] did not latch and took 15 ml.  ? ?Plan 1. To feed based on cues 8-12x 24hr period. Mom to offer breasts and look for signs of milk transfer.  ?2. Mom to supplement with EBM first followed by formula. BF supplementation guide provided. Mom aware if infant not latching for a feeding to offer more.  ?3. DEBP q 3hrs for 15 min ?4 I and O sheet reviewed.  ?All questions answered at the end of the visit.  ? ?Maternal Data ?Has patient been taught Hand Expression?: Yes ?Does the patient have breastfeeding experience prior to this delivery?: Yes ?How long did the patient breastfeed?: 6 momths ? ?Feeding ?Mother's Current Feeding Choice: Breast Milk and Formula ?Nipple Type: Slow - flow ? ?LATCH Score ?Latch: Grasps breast easily, tongue down, lips flanged, rhythmical sucking. ? ?Audible Swallowing: A few with stimulation ? ?Type of Nipple: Everted at rest and after stimulation ? ?Comfort (Breast/Nipple): Soft / non-tender ? ?Hold (Positioning): Assistance needed to correctly position infant at breast and maintain latch. ? ?LATCH Score: 8 ? ? ?Lactation Tools Discussed/Used ?Tools: Pump;Flanges ?Flange Size: 27 ?Breast pump type: Double-Electric Breast Pump ?Pump Education: Setup, frequency, and cleaning;Milk Storage ?Reason for Pumping: increase stimulation ?Pumping frequency: every 3 hrs for 15 min ? ?Interventions ?Interventions: Breast feeding basics reviewed;Breast massage;Hand express;Breast compression;Position options;Expressed milk;Coconut oil;DEBP;Education;Pace feeding;LC Economist;Infant Driven Feeding Algorithm education ? ?Discharge ?Pump: DEBP ?WIC Program: Yes ? ?Consult Status ?Consult Status: Follow-up ?Date: 04/10/21 ?Follow-up type: In-patient ? ? ? ?Jamarrion Budai  Nicholson-Springer ?04/09/2021, 1:55 PM ? ? ? ?

## 2021-04-09 NOTE — Anesthesia Postprocedure Evaluation (Signed)
Anesthesia Post Note ? ?Patient: FRANCESKA STRAHM ? ?Procedure(s) Performed: CESAREAN SECTION MULTI-GESTATIONAL WITH TUBAL (Bilateral) ? ?  ? ?Patient location during evaluation: PACU ?Anesthesia Type: General ?Level of consciousness: sedated ?Pain management: pain level controlled ?Vital Signs Assessment: post-procedure vital signs reviewed and stable ?Respiratory status: spontaneous breathing and respiratory function stable ?Cardiovascular status: stable ?Postop Assessment: no apparent nausea or vomiting ?Anesthetic complications: no ? ? ?No notable events documented. ? ?Last Vitals:  ?Vitals:  ? 04/09/21 1115 04/09/21 1130  ?BP: (!) 131/101 (!) 129/91  ?Pulse: 96   ?Resp: 12 19  ?Temp:    ?SpO2: 97%   ?  ?Last Pain:  ?Vitals:  ? 04/09/21 1200  ?TempSrc: Oral  ?PainSc: 7   ? ?Pain Goal:   ? ?  ?  ?  ?  ?  ?  ?Epidural/Spinal Function Cutaneous sensation: Normal sensation (04/09/21 1200) ? ?Kadedra Vanaken DANIEL ? ? ? ? ?

## 2021-04-09 NOTE — Transfer of Care (Signed)
Immediate Anesthesia Transfer of Care Note ? ?Patient: Catherine Le ? ?Procedure(s) Performed: CESAREAN SECTION MULTI-GESTATIONAL WITH TUBAL (Bilateral) ? ?Patient Location: PACU ? ?Anesthesia Type:General ? ?Level of Consciousness: awake ? ?Airway & Oxygen Therapy: Patient Spontanous Breathing ? ?Post-op Assessment: Report given to RN, Post -op Vital signs reviewed and stable and Patient moving all extremities ? ?Post vital signs: Reviewed and stable ? ?Last Vitals:  ?Vitals Value Taken Time  ?BP 137/100 04/09/21 1030  ?Temp 36.6 ?C 04/09/21 1022  ?Pulse 100 04/09/21 1031  ?Resp 13 04/09/21 1031  ?SpO2 100 % 04/09/21 1031  ?Vitals shown include unvalidated device data. ? ?Last Pain:  ?Vitals:  ? 04/09/21 0615  ?TempSrc: Oral  ?PainSc: 0-No pain  ?   ? ?  ? ?Complications: No notable events documented. ?

## 2021-04-09 NOTE — Progress Notes (Signed)
Attempted to get patient OOB. Patient is able to move great with no dizziness reported. She said her left leg has been off and on numb from pregnancy and baby position in utero so she is nervous to move left leg due to it feeling weak. Two RN's assisted her to stand at bedside x2 but patient was unable to do so, so she was returned back to bed.  RN placed patient back in bed and will attempt to ambulate next time OOB. Standing BP not obtained. Patient declines wanting stronger pain medication. Royston Cowper, RN ? ?

## 2021-04-09 NOTE — Lactation Note (Signed)
This note was copied from a baby's chart. ?Lactation Consultation Note ? ?Patient Name: Catherine Le ?Today's Date: 04/09/2021 ?Reason for consult: Follow-up assessment;Early term 37-38.6wks;Mother's request ?Age:26 hours ? ?[name] ?Mom requested latch assistance with [name]. ?MGM changed soiled diaper while LC in room. ?Mom latched infant on her right breast using the football hold position, infant latched with depth, and BF for 10 minutes. ?LC did suck training, infant doesn't open mouth wide, mom working on extending infant's  lower jaw to help with latch.  ?Afterwards infant ( [name]) was given 20 mls of mom's EBM. ? ?[name] ?Mom attempted to latch [name] but [name] was not interested in breast feeding at this time, mom will wait 30 minutes and attempt to latch infant again, mom was doing skin to skin. ?LC changed void diaper with [name].  ?[place] reinforced for mom to start using DEBP every 3 hours for 15 minutes on initial setting to help her milk supply.  ?Maternal Data ?  ? ?Feeding ?Mother's Current Feeding Choice: Breast Milk ? ?LATCH Score ?Latch: Grasps breast easily, tongue down, lips flanged, rhythmical sucking. ? ?Audible Swallowing: Spontaneous and intermittent ? ?Type of Nipple: Everted at rest and after stimulation ? ?Comfort (Breast/Nipple): Soft / non-tender ? ?Hold (Positioning): Assistance needed to correctly position infant at breast and maintain latch. ? ?LATCH Score: 9 ? ? ?Lactation Tools Discussed/Used ?  ? ?Interventions ?  ? ?Discharge ?  ? ?Consult Status ?Consult Status: Follow-up ?Date: 04/10/21 ?Follow-up type: In-patient ? ? ? ?Vicente Serene ?04/09/2021, 7:22 PM ? ? ? ?

## 2021-04-09 NOTE — Op Note (Signed)
Operative Note  ? ?Patient: Catherine Le ? ?Date of Procedure: 04/09/2021 ? ?Procedure: Primary Low Transverse Cesarean  ? ?Indications:  Di/Di twins, Twin A breech presentation ? ?Pre-operative Diagnosis: Primary Cesarean Section with Bilateral Tubal Ligation Multigestation, Unwanted fertility ? ?Post-operative Diagnosis: Same ? ?TOLAC Candidate: No ? ?Surgeon: Moishe Spice) and Role: ?   * Lazaro Arms, MD - Primary ? ?Assistants: Warner Mccreedy, MD - fellow ? ?An experienced assistant was required given the standard of surgical care given the complexity of the case.  This assistant was needed for exposure, dissection, suctioning, retraction, instrument exchange, assisting with delivery with administration of fundal pressure, and for overall help during the procedure.  ? ?Anesthesia: General (due to patient intolerance of spinal anesthesia attempt) ? ?Anesthesiologist: Heather Roberts, MD  ? ?Antibiotics: Cefazolin ?  ?Estimated Blood Loss: 1200 ml  ? ?Total IV Fluids: 2500 ml ? ?Urine Output:  50 cc OF clear urine ? ?Specimens: bilateral fallopian tubes  ? ?Complications: no complications  ? ?Indications: ?Catherine Le is a 26 y.o. [phone] with an IUP [redacted]w[redacted]d presenting for scheduled cesarean secondary to the indications listed above. ? ?The risks of cesarean section discussed with the patient included but were not limited to: bleeding which may require transfusion or reoperation; infection which may require antibiotics; injury to bowel, bladder, ureters or other surrounding organs; injury to the fetus; need for additional procedures including hysterectomy in the event of a life-threatening hemorrhage; placental abnormalities with subsequent pregnancies, incisional problems, thromboembolic phenomenon and other postoperative/anesthesia complications. The patient concurred with the proposed plan, giving informed written consent for the procedure. Patient has been NPO since last night she will remain NPO for  procedure. Anesthesia and OR aware. Preoperative prophylactic antibiotics and SCDs ordered on call to the OR.  ? ?Findings: Viable infants. Twin A breech and twin B also breech no nuchal cord present. Apgars  ?  ?Catherine Le [[id number]]  ?7  ?  ?Catherine Le [[id number]]  ?4  ?,  ?  ?Catherine Le [[id number]]  ?9  ?  ?Catherine Le [[id number]]  ?9  ?,  ?  ?Catherine Le [[id number]]  ? ?  ?Catherine Le [[id number]]  ? ?. Weight  ?  ?Catherine Le [[id number]]  ?  ?  ?Catherine Le [[id number]]  ?2835 g . Clear amniotic fluid. Normal placenta, three vessel cord. Normal uterus, Normal bilateral fallopian tubes, Normal bilateral ovaries. ? ?Procedure Details: A Time Out was held and the above information confirmed. The patient received intravenous antibiotics and had sequential compression devices applied to her lower extremities preoperatively. The patient was taken back to the operative suite and there were extensive efforts made to administer spinal anesthesia. However due to patient's anxiety regarding spinal and despite multiple attempts of discussions to reassure patient ultimately she opted for general anesthesia. After induction of anesthesia, the patient was draped and prepped in the usual sterile manner and placed in a dorsal supine position with a leftward tilt. A low transverse skin incision was made with scalpel and carried down through the subcutaneous tissue to the fascia. Fascial incision was made and extended transversely. The fascia was separated from the underlying rectus tissue superiorly and inferiorly. The rectus muscles were separated in the midline bluntly and the peritoneum was entered bluntly. A Rich retractor and bladder blade were placed to aid in visualization of the uterus. A bladder flap was not developed. A low transverse uterine incision was made. The infant A  was successfully delivered from breech  presentation, the  umbilical cord was clamped immediately.  At that time attention was placed on infant B. The foot was palpated through the amniotic sac an aliss retractor was used to rupture the sac. The foot was successfully delivered and ultimate a breech delivery was done .Cord ph was not sent, and cord blood was obtained for evaluation. The placentas were removed Intact and appeared normal. The uterine incision was closed with running locked sutures of 0-Monocryl, and then a second imbricating layer was also placed with 0-Monocryl. Overall, excellent hemostasis was noted.  ? ?Attention was then turned to the fallopian tubes. Bilateral salpingectomy: A Kelly clamp was placed across the left fallopian tube taking care to incorporate the fimbriae. A second clamp was then placed below the first. The fallopian tube was then removed with Metzenbaum scissors. The pedicle was then ligated with plain gut suture and the second clamp was removed with excellent hemostasis noted. Then a second ligature of  plain gut suture suture was placed below the remaining clamp, the clamp was then removed and again excellent hemostasis was observed. The same procedure was then carried out on the right fallopian tube with excellent hemostasis noted. ? ?The abdomen and the pelvis were cleared of all clot and debris. At that time the tubes were inspected again and the left tube was found to have a small ooze and an additional stitch was used to place a knot around the pedicle. Hemostasis was confirmed on all surfaces.  The peritoneum was was not reapproximated. The fascia was then closed using 0 Vicryl in a running fashion. The subcutaneous layer was reapproximated with plain gut and the skin was closed with a 4-0 vicryl subcuticular stitch. The patient tolerated the procedure well. Sponge, lap, instrument and needle counts were correct x 2. She was taken to the recovery room in stable condition. ? ?Disposition: PACU - hemodynamically stable. Infants taken  to newborn nursery as patient was under general anesthesia. ? ? ?Signed: ?Warner Mccreedy, MD, MPH ?[facility]) ? ?

## 2021-04-09 NOTE — H&P (Signed)
Patient ID: Christena Flake, female   DOB: [birth year], 26 y.o.   MRN: [id number] ?Preoperative History and Physical ? ?CARITA SOLLARS is a 26 y.o. [id number] [redacted]w[redacted]d Estimated Date of Delivery: 04/23/21 Estimated Date of Delivery: 04/23/21  with Patient's last menstrual period was 07/17/2020. admitted for a primary Caesarean section, twin A is breech. Pregnancy is otherwise uncomplicated ? ? ?PMH:    ?Past Medical History:  ?Diagnosis Date  ? Headache   ? History of postpartum depression   ? Irregular bleeding 06/16/2015  ? [place] 06/16/2015  ? ? ?PSH:     ?Past Surgical History:  ?Procedure Laterality Date  ? TONSILLECTOMY    ? ? ?POb/GynH:      ?OB History   ? ? Gravida  ?5  ? Para  ?2  ? Term  ?2  ? Preterm  ?   ? AB  ?2  ? Living  ?2  ?  ? ? SAB  ?2  ? IAB  ?   ? Ectopic  ?   ? Multiple  ?0  ? Live Births  ?2  ?   ?  ?  ? ? ?SH:   ?Social History  ? ?Tobacco Use  ? Smoking status: Never  ? Smokeless tobacco: Never  ?Vaping Use  ? Vaping Use: Never used  ?Substance Use Topics  ? Alcohol use: No  ? Drug use: No  ? ? ?FH:    ?Family History  ?Problem Relation Age of Onset  ? Hypertension Father   ? Asthma Father   ? Cancer Maternal Aunt   ?     breast  ? Hypertension Maternal Grandmother   ? Cancer Maternal Grandmother   ?     lung  ? Hypertension Maternal Grandfather   ? ? ? ?Allergies:  ?Allergies  ?Allergen Reactions  ? Shrimp [Shellfish Allergy] Shortness Of Breath  ? ? ?Medications:       ?Current Facility-Administered Medications:  ?  acetaminophen (TYLENOL) tablet 1,000 mg, 1,000 mg, Oral, Once, Heather Roberts, MD ?  ceFAZolin (ANCEF) IVPB 2g/100 mL premix, 2 g, Intravenous, On Call to OR, Hermina Staggers, MD ?  celecoxib (CELEBREX) capsule 200 mg, 200 mg, Oral, Once, Heather Roberts, MD ?  lactated ringers infusion, , Intravenous, Continuous, Hermina Staggers, MD, Last Rate: 125 mL/hr at 04/09/21 0634, New Bag at 04/09/21 0634 ?  scopolamine (TRANSDERM-SCOP) 1 MG/3DAYS 1.5 mg, 1 patch, Transdermal, Q72H, Heather Roberts, MD, 1.5 mg at 04/09/21 3710 ?  sodium citrate-citric acid (ORACIT) solution 30 mL, 30 mL, Oral, On Call to OR, Hermina Staggers, MD ? ?Review of Systems:  ? ?Review of Systems  ?Constitutional: Negative for fever, chills, weight loss, malaise/fatigue and diaphoresis.  ?HENT: Negative for hearing loss, ear pain, nosebleeds, congestion, sore throat, neck pain, tinnitus and ear discharge.   ?Eyes: Negative for blurred vision, double vision, photophobia, pain, discharge and redness.  ?Respiratory: Negative for cough, hemoptysis, sputum production, shortness of breath, wheezing and stridor.   ?Cardiovascular: Negative for chest pain, palpitations, orthopnea, claudication, leg swelling and PND.  ?Gastrointestinal: Positive for abdominal pain. Negative for heartburn, nausea, vomiting, diarrhea, constipation, blood in stool and melena.  ?Genitourinary: Negative for dysuria, urgency, frequency, hematuria and flank pain.  ?Musculoskeletal: Negative for myalgias, back pain, joint pain and falls.  ?Skin: Negative for itching and rash.  ?Neurological: Negative for dizziness, tingling, tremors, sensory change, speech change, focal weakness, seizures, loss of consciousness, weakness and headaches.  ?Endo/Heme/Allergies: Negative for environmental  allergies and polydipsia. Does not bruise/bleed easily.  ?Psychiatric/Behavioral: Negative for depression, suicidal ideas, hallucinations, memory loss and substance abuse. The patient is not nervous/anxious and does not have insomnia.   ? ? ? ?PHYSICAL EXAM: ? ?Blood pressure 138/90, pulse (!) 114, temperature 98.3 ?F (36.8 ?C), temperature source Oral, resp. rate 19, height 5\' 1"  (1.549 m), weight 127.8 kg, last menstrual period 07/17/2020, SpO2 97 %, unknown if currently breastfeeding. ? ?  ?Vitals reviewed. ?Constitutional: She is oriented to person, place, and time. She appears well-developed and well-nourished.  ?HENT:  ?Head: Normocephalic and atraumatic.  ?Right Ear:  External ear normal.  ?Left Ear: External ear normal.  ?Nose: Nose normal.  ?Mouth/Throat: Oropharynx is clear and moist.  ?Eyes: Conjunctivae and EOM are normal. Pupils are equal, round, and reactive to light. Right eye exhibits no discharge. Left eye exhibits no discharge. No scleral icterus.  ?Neck: Normal range of motion. Neck supple. No tracheal deviation present. No thyromegaly present.  ?Cardiovascular: Normal rate, regular rhythm, normal heart sounds and intact distal pulses.  Exam reveals no gallop and no friction rub.   ?No murmur heard. ?Respiratory: Effort normal and breath sounds normal. No respiratory distress. She has no wheezes. She has no rales. She exhibits no tenderness.  ?GI: Soft. Bowel sounds are normal. She exhibits no distension and no mass. There is tenderness. There is no rebound and no guarding.  ?Genitourinary:  ?     Vulva is normal without lesions ?Vagina is pink moist without discharge ?Cervix normal in appearance and pap is normal ?Uterus is enlarged, 38 weeks size ?Adnexa is negative with normal sized ovaries by sonogram  ?Musculoskeletal: Normal range of motion. She exhibits no edema and no tenderness.  ?Neurological: She is alert and oriented to person, place, and time. She has normal reflexes. She displays normal reflexes. No cranial nerve deficit. She exhibits normal muscle tone. Coordination normal.  ?Skin: Skin is warm and dry. No rash noted. No erythema. No pallor.  ?Psychiatric: She has a normal mood and affect. Her behavior is normal. Judgment and thought content normal.  ? ? ?Labs: ?Results for orders placed or performed during the hospital encounter of 04/07/21 (from the past 336 hour(s))  ?Type and screen  ? Collection Time: 04/07/21 10:47 AM  ?Result Value Ref Range  ? ABO/RH(D) A POS   ? Antibody Screen NEG   ? Sample Expiration    ?  04/10/2021,2359 ?Performed at [facility], [address] [place], [place] [address] ?  ?CBC  ? Collection Time: 04/07/21 11:00  AM  ?Result Value Ref Range  ? WBC 11.9 (H) 4.0 - 10.5 K/uL  ? RBC 3.74 (L) 3.87 - 5.11 MIL/uL  ? Hemoglobin 9.5 (L) 12.0 - 15.0 g/dL  ? HCT 30.1 (L) 36.0 - 46.0 %  ? MCV 80.5 80.0 - 100.0 fL  ? MCH 25.4 (L) 26.0 - 34.0 pg  ? MCHC 31.6 30.0 - 36.0 g/dL  ? RDW 16.7 (H) 11.5 - 15.5 %  ? Platelets 290 150 - 400 K/uL  ? nRBC 0.0 0.0 - 0.2 %  ?RPR  ? Collection Time: 04/07/21 11:00 AM  ?Result Value Ref Range  ? RPR Ser Ql NON REACTIVE NON REACTIVE  ? ? ?EKG: ?Orders placed or performed during the hospital encounter of 08/11/20  ? ED EKG  ? ED EKG  ? EKG  ? ? ?Imaging Studies: ?US MFM FETAL BPP WO NON STRESS ? ?Result Date: 04/05/2021 ?----------------------------------------------------------------------  OBSTETRICS REPORT                    (  Corrected Final 04/05/2021 12:53 pm) ---------------------------------------------------------------------- Patient Info  ID #:       [id number]                          D.O.B.:  19-Feb-1995 (26 yrs)  Name:       Marlin Canary Sand               Visit Date: 04/05/2021 11:31 am ---------------------------------------------------------------------- Performed By  Attending:        Noralee Space MD        Ref. Address:      [address]                                                              [place]                                                              [place] [address]  Performed By:     Marcellina Millin       [facility]  Referred By:      Jorje Guild CNM ---------------------------------------------------------------------- Orders  #  Description                           Code        Ordered By  1  [place] MFM FETAL BPP WO NON               76819.01    JENNIFER OZAN     STRESS  2  [place] MFM FETAL BPP WO NST                21194.1     Myna Hidalgo     ADDL GESTATION ----------------------------------------------------------------------  #  Order #  Accession #                Episode #  1  [id number]

## 2021-04-10 ENCOUNTER — Encounter (HOSPITAL_COMMUNITY): Payer: Self-pay | Admitting: Obstetrics & Gynecology

## 2021-04-10 NOTE — Progress Notes (Addendum)
Addendum done in error - pad ? ?Post Partum Day 1 ?Subjective: ?Patient reports doing well overall. She is having difficulty with ambulation due to pain in her incision. She reports the pain is expected and manageable with pain medication ?  ?Objective: ?Blood pressure 112/72, pulse 84, temperature 99.3 ?F (37.4 ?C), temperature source Oral, resp. rate 18, height 5\' 1"  (1.549 m), weight 127.8 kg, last menstrual period 07/17/2020, SpO2 100 %, unknown if currently breastfeeding. ?  ?Physical Exam:  ?General: alert, cooperative, and no distress ?Lochia: appropriate ?Uterine Fundus: firm ?Incision: no significant drainage, no dehiscence, no significant erythema ?DVT Evaluation: No evidence of DVT seen on physical exam. ?  ?Recent Labs (last 2 labs)   ?    ?Recent Labs  ?  04/07/21 ?1100 04/09/21 ?1436  ?HGB 9.5* 7.5*  ?HCT 30.1* 24.5*  ?  ?  ?  ?Assessment/Plan: ?  ?Patient declined repeat Hgb this am. Denies any dizziness or lightheadedness. Reports feeling well overall and will ambulate more today.  ?  ? LOS: 1 day  ?  ?Wende Mott CNM ?04/10/2021, 9:29 AM  ?

## 2021-04-10 NOTE — Lactation Note (Addendum)
This note was copied from a baby's chart. ?Lactation Consultation Note ? ?Patient Name: Catherine Le ?Today's Date: 04/10/2021 ?Reason for consult: Follow-up assessment;Early term 37-38.6wks;Infant weight loss;Breastfeeding assistance;Other (Comment) ?Age:26 hours - [name]  ?Its been 4 hours since the baby fed . LC reminded mom since the baby is early term its important to feed by 3 hours.  ?Baby noted to be sluggish, 1st provided EBM for appetizer and then baby latched for a few sucks and off.  ?Continued feeding the bottle pace feeding using the yellow nipple , with some leakage from the sides of the mouth. LC recommended to the RN - C. Hubbard to work with mom with a white ( standard nipple ). Latch 5  ?LC encouraged mom to post pump after every feeding when she feels up to it .  ?LC encourage to feed with feeding cues and by 3 hours , attempt at the breast 1st and if sluggish feed appetizer, and attempt not more that 10 mins and finish feeding with supplement with her milk gradually increasing volume.  ?DEBP had already been set up . Per mom had not pumped since the DEBP had been set up.  ? ?Moms friend brought in EBM defrosting from home - per mom had pumped  at home. LC recommended for the supplementing to use her EBM and not formula  so her EBM did not go to waste since it has to be used within 24 hours since it was defrosting.  ?Maternal Data ?Has patient been taught Hand Expression?: Yes ? ?Feeding ?Mother's Current Feeding Choice: Breast Milk ? ?LATCH Score ?Latch: Repeated attempts needed to sustain latch, nipple held in mouth throughout feeding, stimulation needed to elicit sucking reflex. (latched only for a few sucks and off - able to latch over the nipple / areola complex) ? ?Audible Swallowing: None ? ?Type of Nipple: Everted at rest and after stimulation (areolas dry and some edema / reverse pressure helped) ? ?Comfort (Breast/Nipple): Soft / non-tender ? ?Hold (Positioning): Full assist,  staff holds infant at breast ? ?LATCH Score: 5 ? ? ?Lactation Tools Discussed/Used ?Tools: Pump (DEBP already set up and per mom has not pumped) ?Breast pump type: Double-Electric Breast Pump ? ?Interventions ?Interventions: Breast feeding basics reviewed;Assisted with latch;Skin to skin;Adjust position;Support pillows;Position options;DEBP;Education ? ?Discharge ?  ? ?Consult Status ?Consult Status: Follow-up ?Date: 04/10/21 ?Follow-up type: In-patient ? ? ? ?Matilde Sprang Levii Hairfield ?04/10/2021, 10:40 AM ? ? ? ?

## 2021-04-10 NOTE — Progress Notes (Signed)
CSW received consult for hx of Anxiety and Depression.  CSW met with MOB to offer support and complete assessment. MOB was sitting on couch and infants were asleep in their basinets. MOB was welcoming and remained engaged during assessment. CSW and MOB discussed MOB's mental health history. MOB reported that she was diagnosed with postpartum depression in 2017 after having her second daughter. MOB described her postpartum depression as being sad, isolating, and not wanting to do stuff. MOB reported that her postpartum depression lasted for a little over a year. MOB reported that she participated in therapy for her PPD symptoms. MOB reported that she is not currently taking any medication nor participating in therapy. MOB denied needing any therapy resources. MOB denied any additional mental health history. CSW inquired about how MOB was feeling emotionally since giving birth, MOB reported that she was tired. MOB endorsed having slight anxiety yesterday before going into the OR, MOB shared that this is her first C-section. MOB shared having some anxiety today because this is her first time alone with infants. CSW acknowledged, validated, and normalized MOB's feelings of anxiety. MOB reported that her friend had just left and other supports would be coming in. MOB presented calm and did not demonstrate any acute mental health signs/symptoms. CSW assessed for safety, MOB denied SI, HI, and domestic violence. CSW inquired about MOB's support system, MOB reported the her mom is a support.  ? ?CSW provided education regarding the baby blues period vs. perinatal mood disorders, discussed treatment and gave resources for mental health follow up if concerns arise.  CSW recommends self-evaluation during the postpartum time period using the New Mom Checklist from Postpartum Progress and encouraged MOB to contact a medical professional if symptoms are noted at any time.   ? ?CSW provided review of Sudden Infant Death Syndrome  (SIDS) precautions. MOB verbalized understanding and reported having all items needed to care for infants including 2 car seats and a double basinet.   ? ?CSW identifies no further need for intervention and no barriers to discharge at this time. ? ?Abundio Miu, LCSW ?Clinical Social Worker ?[facility] ?Cell#: [phone] ?

## 2021-04-10 NOTE — Progress Notes (Signed)
Patient refused to let the phlebotomist draw her blood this morning.  Education regarding to the need for the lab provided and patient voiced understand.   ?

## 2021-04-10 NOTE — Lactation Note (Signed)
This note was copied from a baby's chart. ?Lactation Consultation Note ? ?Patient Name: Catherine Le ?Today's Date: 04/10/2021 ?Reason for consult: Follow-up assessment;Mother's request;Early term 37-38.6wks;Infant weight loss;Breastfeeding assistance;Other (Comment) ?Age:26 hours ?[name] -  ?Baby wide awake, and hungry. LC changed a wet and stool.  ?Baby latched for 12 mins with few swallows and nipple well rounded.  ?EBM 20 ml taken well with pace feeding.  ?Latch score 7 .  ?See [name] for [facility] plan.  ? ?Maternal Data ?  ? ?Feeding ?Mother's Current Feeding Choice: Breast Milk and Formula ?Nipple Type: Slow - flow ? ?LATCH Score ?Latch: Repeated attempts needed to sustain latch, nipple held in mouth throughout feeding, stimulation needed to elicit sucking reflex. ? ?Audible Swallowing: A few with stimulation ? ?Type of Nipple: Everted at rest and after stimulation ? ?Comfort (Breast/Nipple): Soft / non-tender ? ?Hold (Positioning): Assistance needed to correctly position infant at breast and maintain latch. ? ?LATCH Score: 7 ? ? ?Lactation Tools Discussed/Used ?  ? ?Interventions ?Interventions: Breast feeding basics reviewed;Assisted with latch;Skin to skin;Breast massage;Hand express;Breast compression;Adjust position;Support pillows;Position options;DEBP;Education ? ?Discharge ?  ? ?Consult Status ?Consult Status: Follow-up ?Date: 04/11/21 ?Follow-up type: In-patient ? ? ? ?Catherine Le ?04/10/2021, 9:56 AM ? ? ? ?

## 2021-04-11 MED ORDER — FUROSEMIDE 20 MG PO TABS
20.0000 mg | ORAL_TABLET | Freq: Every day | ORAL | Status: DC
  Administered 2021-04-11 – 2021-04-12 (×2): 20 mg via ORAL
  Filled 2021-04-11 (×2): qty 1

## 2021-04-11 NOTE — Progress Notes (Signed)
Post Partum Day 2 ?Subjective: ?Patient reports doing well overall. She reports the pain is expected and manageable with pain medication. Minimal lochia. Voiding and passing gas.  ? ?Objective: ?Blood pressure 114/67, pulse 100, temperature 98.5 ?F (36.9 ?C), temperature source Oral, resp. rate 18, height 5\' 1"  (1.549 m), weight 127.8 kg, last menstrual period 07/17/2020, SpO2 100 %, unknown if currently breastfeeding. ? ?Physical Exam:  ?General: alert, cooperative, and no distress ?Lochia: appropriate ?Uterine Fundus: firm ?Incision: no significant drainage, no dehiscence, no significant erythema ?DVT Evaluation: No evidence of DVT seen on physical exam. ? ?Recent Labs  ?  04/09/21 ?1436  ?HGB 7.5*  ?HCT 24.5*  ? ? ? ?Assessment/Plan: ? ?Continue postop goals ?S/p IV iron. Declined CBC 4/1. VSS stable ?Had pp elevated Bps but normotensive now, sometimes 80s/50s. Will hold procardia and start Lasix 20 daily. Discussed with pt and she is agreeable.  ? ? LOS: 2 days  ? ?Radene Gunning  ?04/11/2021, 4:03 PM  ? ? ?

## 2021-04-11 NOTE — Lactation Note (Signed)
This note was copied from a baby's chart. ?Lactation Consultation Note ? ?Patient Name: Catherine Le ?Today's Date: 04/11/2021 ?Reason for consult: Mother's request;Difficult latch;Follow-up assessment;Multiple gestation;Early term 37-38.6wks ?Age:26 hours ?Mom requested [place] services tonight. ?Per mom, she has been bottle feeding infant formula today and not latching infant at the breast. ?Mom brought more of her frozen breast milk.to hospital this morning. ?[place] encouraged mom to give infant her EBM instead of formula. ?[name] ?Mom attempted to latch infant but infant only held nipple in mouth and would not latch. ?Infant was given 15 mls of mom's EBM by yellow slow flow bottle nipple, infant was pace feed. ?[place] encouraged mom to start using the DEBP to help stimulate and establish her milk supply, pumping every 3 hours for 15 minutes on initial setting.   ?Mom knows her frozen EBM once thawed must be used within 24 hours.  ? ?[name] ?Per mom, [name] is latching well at the breast. ?LC did not assist with latching [name] at the breast due infant receiving 45 mls of formula prior to [facility] entering the room. ? ?[place] encouraged mom attempt latch infant's at breast first and then supplement infant with her EBM before formula. ?Start Pumping with DEBP every 3 hours for 15 minutes on initial setting. ?Mom to continue to ask RN/LC for further latch assistance if needed.  ?Maternal Data ?  ? ?Feeding ?Mother's Current Feeding Choice: Breast Milk and Formula ? ?LATCH Score for [name] ?Latch: Too sleepy or reluctant, no latch achieved, no sucking elicited. ? ?Audible Swallowing: None ? ?Type of Nipple: Everted at rest and after stimulation ? ?Comfort (Breast/Nipple): Soft / non-tender ? ?Hold (Positioning): Assistance needed to correctly position infant at breast and maintain latch. ? ?LATCH Score: 5 ? ? ?Lactation Tools Discussed/Used ?  ? ?Interventions ?Interventions: Assisted with latch;Skin to skin;Breast  compression;Support pillows;Adjust position;Position options;DEBP;Education ? ?Discharge ?  ? ?Consult Status ?Consult Status: Follow-up ?Date: 04/11/21 ?Follow-up type: In-patient ? ? ? ?Vicente Serene ?04/11/2021, 1:04 AM ? ? ? ?

## 2021-04-11 NOTE — Lactation Note (Signed)
This note was copied from a baby's chart. ?Lactation Consultation Note ? ?Patient Name: Catherine Le ?Today's Date: 04/11/2021 ?Reason for consult: Follow-up assessment;Multiple gestation;Early term 37-38.6wks ?Age:26 hours ? ?Twins 44 hours old.   ?Twin B cueing.  Assisted with latching off and on at the breast.  Mother's breasts are filling.  Suggest applying warm moist compresses to breasts before pumping. ?Baby frustrated with flow at breast.  Discussed paced feeding and gave baby 30 ml. ?After baby latched briefly and then babies were taken to the nursery for echos.  Mother will post pump. ? ? ?Feeding ?Mother's Current Feeding Choice: Breast Milk and Formula ?Nipple Type: Slow - flow ? ?LATCH Score ?Latch: Repeated attempts needed to sustain latch, nipple held in mouth throughout feeding, stimulation needed to elicit sucking reflex. ? ?Audible Swallowing: A few with stimulation ? ?Type of Nipple: Everted at rest and after stimulation ? ?Comfort (Breast/Nipple): Soft / non-tender ? ?Hold (Positioning): Assistance needed to correctly position infant at breast and maintain latch. ? ?LATCH Score: 7 ? ? ?Lactation Tools Discussed/Used ?  ? ?Interventions ?Interventions: Breast feeding basics reviewed;Assisted with latch;Skin to skin;Hand express;DEBP;Education ? ?Discharge ?Pump: Personal;DEBP ? ?Consult Status ?Consult Status: Follow-up ?Date: 04/12/21 ?Follow-up type: In-patient ? ? ? ?Vivianne Master Boschen ?04/11/2021, 2:52 PM ? ? ? ?

## 2021-04-12 ENCOUNTER — Other Ambulatory Visit (HOSPITAL_COMMUNITY): Payer: Self-pay

## 2021-04-12 MED ORDER — IBUPROFEN 600 MG PO TABS
600.0000 mg | ORAL_TABLET | Freq: Four times a day (QID) | ORAL | 0 refills | Status: DC
  Filled 2021-04-12: qty 30, 8d supply, fill #0

## 2021-04-12 MED ORDER — ACETAMINOPHEN 500 MG PO TABS
1000.0000 mg | ORAL_TABLET | Freq: Four times a day (QID) | ORAL | 0 refills | Status: DC
  Filled 2021-04-12: qty 30, 4d supply, fill #0

## 2021-04-12 MED ORDER — OXYCODONE HCL 5 MG PO TABS
5.0000 mg | ORAL_TABLET | Freq: Four times a day (QID) | ORAL | 0 refills | Status: DC | PRN
  Filled 2021-04-12: qty 20, 5d supply, fill #0

## 2021-04-12 MED ORDER — FUROSEMIDE 20 MG PO TABS
20.0000 mg | ORAL_TABLET | Freq: Every day | ORAL | 0 refills | Status: DC
  Filled 2021-04-12: qty 4, 4d supply, fill #0

## 2021-04-12 NOTE — Progress Notes (Addendum)
Subjective: ?Postpartum Day 3: Cesarean Delivery ?Patient reports tolerating PO, + flatus, and no problems voiding or ambulating. She reports painful foot swelling and fatigue. ? ?Objective: ?Vital signs in last 24 hours: ?Temp:  [98.5 ?F (36.9 ?C)-99 ?F (37.2 ?C)] 99 ?F (37.2 ?C) (04/03 0525) ?Pulse Rate:  [100-110] 110 (04/03 0525) ?Resp:  [16-18] 18 (04/03 0525) ?BP: (100-114)/(60-70) 110/70 (04/03 0525) ? ?Physical Exam:  ?General: alert, cooperative, and fatigued ?Lochia: appropriate ?Uterine Fundus: firm ?Incision: healing well, no significant drainage, no dehiscence ?DVT Evaluation: No evidence of DVT seen on physical exam. ?Calf/Ankle edema is present. Pedal edema present, non-pitting ? ?Recent Labs  ?  04/09/21 ?1436  ?HGB 7.5*  ?HCT 24.5*  ? ? ?Assessment/Plan: ?Status post primary cesarean section of di/di twin gestation at [redacted]w[redacted]d Doing well postoperatively.  ?Continue current care. ?Postpartum Teaching:   ?Discussed with patient appropriate postpartum nutrition, hydration, breastfeeding, activity, avoiding vaginal insertion until lochia complete, and typical postpartum course. Patient advised to notify with excessive bleeding or passing large clots, uncontrolled pain, s/s of postpartum depression, or s/s of DVT.  Desires d/c tomorrow.  ? ? ?Electronically Signed By: ?OGreggory Keen SNM ?04/12/21 6:57 AM   ? ? ?I personally saw and evaluated the patient, performing the key elements of the service. I developed and verified the management plan that is described in the resident's/student's note, and I agree with the content with my edits above. VSS, HRR&R, Resp unlabored, Legs neg. Pt does not meet criteria for extended stay. Will DC today. ?FNigel Berthold CNM ?04/12/2021 ?9:48 AM ?  ? ? ?

## 2021-04-12 NOTE — Lactation Note (Addendum)
This note was copied from a baby's chart. ?Lactation Consultation Note ? ?Patient Name: Catherine Le ?Today's Date: 04/12/2021 ?Reason for consult: Follow-up assessment;Mother's request;Difficult latch;Early term 37-38.6wks;Multiple gestation;Breastfeeding assistance ?Age:26 days ? ?LC observed feeding with [name] using Nfant purple nipple infant with better feeding results  infant took 14 ml in less amount of time than yellow slow flow nipple. LC alerted RN, Ephriam Jenkins to observe a feeding, SLP consult discussed with provider by RN.  ? ?Plan 1. To feed based on cues 8-12x 24hr period.  ?2. Mom to supplement with EBM working to increase volume with Nfant nipple/. BF supplementation guide provided, parents aware to offer more if infant not going to breast. Mom timing length of feeding with the bottle since infant B having some trouble.  ?3. DEPB q 3hrs for 15 min  ? ? ?Maternal Data ?  ? ?Feeding ?Mother's Current Feeding Choice: Breast Milk ?Nipple Type: Nfant Slow Flow (purple) ? ?LATCH Score ?  ? ?  ? ?  ? ?  ? ?  ? ?  ? ? ?Lactation Tools Discussed/Used ?Tools: Pump;Flanges ? ?Interventions ?Interventions: Breast feeding basics reviewed;Breast massage;Hand express;Breast compression;Expressed milk;Coconut oil;DEBP;Pace feeding;Education;Infant Driven Feeding Algorithm education ? ?Discharge ?  ? ?Consult Status ?Consult Status: Follow-up ?Date: 04/13/21 ?Follow-up type: In-patient ? ? ? ?Menachem Urbanek  Nicholson-Springer ?04/12/2021, 5:07 PM ? ? ? ?

## 2021-04-12 NOTE — Lactation Note (Signed)
This note was copied from a baby's chart. ?Lactation Consultation Note ? ?Patient Name: Catherine Le ?Today's Date: 04/12/2021 ?Reason for consult: Follow-up assessment;Mother's request;Multiple gestation;Early term 37-38.6wks;Nipple pain/trauma;Breastfeeding assistance ?Age:26 days ? ?Grandmother feeding [name] pumped breast milk with yellow slow flow nipple.  ? ? ?Mom breast are full, hard and painful. LC adjusted flange size from 24 to 27 and then on to 30 states comfortable fit. Mom massage with coconut oil to move milk while pumping.  ? ?Maternal Data ?  ? ?Feeding ?Mother's Current Feeding Choice: Breast Milk ? ?LATCH Score ?  ? ?  ? ?  ? ?  ? ?  ? ?  ? ? ?Lactation Tools Discussed/Used ?Tools: Pump;Flanges ?Flange Size: 30 ?Breast pump type: Double-Electric Breast Pump ?Pump Education: Setup, frequency, and cleaning;Milk Storage (Mom states nipple pain. LC increased flange size from 24 to 27 and then to 30. States comfortable fit. Mom to massage with coconut oil to remove milk) ?Reason for Pumping: increase stimulation ?Pumping frequency: every 3 hrs for 15 min ? ?Interventions ?Interventions: Breast feeding basics reviewed;Breast massage;Expressed milk;DEBP;Education;Pace feeding;Infant Driven Feeding Algorithm education ? ?Discharge ?Discharge Education: Engorgement and breast care;Warning signs for feeding baby;Outpatient recommendation ?Pump: Personal ? ?Consult Status ?Consult Status: Follow-up ?Date: 04/13/21 ?Follow-up type: In-patient ? ? ? ?Tima Curet  Nicholson-Springer ?04/12/2021, 5:03 PM ? ? ? ?

## 2021-04-13 ENCOUNTER — Ambulatory Visit: Payer: Self-pay

## 2021-04-13 LAB — SURGICAL PATHOLOGY

## 2021-04-13 NOTE — Lactation Note (Signed)
This note was copied from a baby's chart. ?Lactation Consultation Note ? ?Patient Name: Catherine Le ?Today's Date: 04/13/2021 ?Reason for consult: Follow-up assessment;Mother's request;Multiple gestation;Early term 37-38.6wks;Breastfeeding assistance ?Age:26 days ? ?Mom states [name] doing well feeding either EBM or formula 50 ml per feeding.  ? ?[name] seen by SLP with a feeding plan using Dr. Manson Passey preemie nipple. Feeding volumes increased to 30 ml or more per feeding.  ? ?Mom states breasts are softened with use of 30 flanges and she no longer has discomfort with latching or pumping.  ? ?Mom on the phone with WIC to arrange getting an electric pump for home.  ?All questions answered at the end of the visit.  ? ?Maternal Data ?  ? ?Feeding ?Mother's Current Feeding Choice: Breast Milk and Formula ?Nipple Type: Slow - flow ? ?LATCH Score ?  ? ?  ? ?  ? ?  ? ?  ? ?  ? ? ?Lactation Tools Discussed/Used ?Tools: Pump;Flanges;Coconut oil ?Flange Size: 30 ?Breast pump type: Double-Electric Breast Pump ?Pump Education: Setup, frequency, and cleaning;Milk Storage ?Reason for Pumping: increase stimulation ?Pumping frequency: every 3 hrs for 15 maintenance ? ?Interventions ?Interventions: Breast feeding basics reviewed;Breast massage;Hand express;Breast compression;Expressed milk;Coconut oil;DEBP;Education;Pace feeding;LC Services brochure;Infant Driven Feeding Algorithm education ? ?Discharge ?Discharge Education: Engorgement and breast care;Warning signs for feeding baby ?Pump: Manual ?WIC Program: Yes ? ?Consult Status ?Consult Status: Complete ?Date: 04/13/21 ?Follow-up type: In-patient ? ? ? ?Naveya Ellerman  Nicholson-Springer ?04/13/2021, 11:55 AM ? ? ? ?

## 2021-04-14 ENCOUNTER — Encounter: Payer: Medicaid Other | Admitting: Advanced Practice Midwife

## 2021-04-14 ENCOUNTER — Other Ambulatory Visit: Payer: Medicaid Other

## 2021-04-19 ENCOUNTER — Encounter: Payer: Self-pay | Admitting: Obstetrics & Gynecology

## 2021-04-19 ENCOUNTER — Ambulatory Visit (INDEPENDENT_AMBULATORY_CARE_PROVIDER_SITE_OTHER): Payer: Medicaid Other | Admitting: Obstetrics & Gynecology

## 2021-04-19 VITALS — BP 124/80 | HR 110 | Ht 62.0 in | Wt 244.0 lb

## 2021-04-19 DIAGNOSIS — Z98891 History of uterine scar from previous surgery: Secondary | ICD-10-CM

## 2021-04-19 MED ORDER — HYDROCODONE-ACETAMINOPHEN 5-325 MG PO TABS: 1.0000 | ORAL_TABLET | Freq: Four times a day (QID) | ORAL | 0 refills | Status: DC | PRN

## 2021-04-19 NOTE — Progress Notes (Signed)
  HPI: Patient returns for routine postoperative follow-up having undergone C section + BTLon 04/09/21.  The patient's immediate postoperative recovery has been unremarkable. Since hospital discharge the patient reports no problems.   Current Outpatient Medications: acetaminophen (TYLENOL) 500 MG tablet, Take 2 tablets (1,000 mg total) by mouth every 6 (six) hours., Disp: 30 tablet, Rfl: 0 ibuprofen (ADVIL) 600 MG tablet, Take 1 tablet (600 mg total) by mouth every 6 (six) hours., Disp: 30 tablet, Rfl: 0 Prenatal Vit-Fe Fumarate-FA (PRENATAL MULTIVITAMIN) TABS tablet, Take 1 tablet by mouth in the morning., Disp: , Rfl:  ferrous sulfate 325 (65 FE) MG tablet, Take 1 tablet (325 mg total) by mouth every other day. (Patient not taking: Reported on 04/19/2021), Disp: 45 tablet, Rfl: 2 oxyCODONE (OXY IR/ROXICODONE) 5 MG immediate release tablet, Take 1 tablet (5 mg total) by mouth every 6 (six) hours as needed for severe pain. (Patient not taking: Reported on 04/19/2021), Disp: 20 tablet, Rfl: 0  No current facility-administered medications for this visit.    Blood pressure 124/80, pulse (!) 110, height 5\' 2"  (1.575 m), weight 244 lb (110.7 kg), last menstrual period 07/17/2020, currently breastfeeding.  Physical Exam: Incision clean dry intact Abdomen is benign  Diagnostic Tests:   Pathology: benign  Impression + Management plan:   ICD-10-CM   1. S/P cesarean section + Bilateral salpingectomy 04/09/21  Z98.891          Medications Prescribed this encounter: No orders of the defined types were placed in this encounter.     Follow up: Return in about 1 month (around 05/19/2021) for post partum visit.    Florian Buff, MD Attending Physician for the [facility] 04/19/2021 12:46 PM

## 2021-04-21 ENCOUNTER — Other Ambulatory Visit: Payer: Medicaid Other

## 2021-04-21 ENCOUNTER — Encounter: Payer: Medicaid Other | Admitting: Obstetrics & Gynecology

## 2021-04-27 ENCOUNTER — Encounter: Payer: Self-pay | Admitting: *Deleted

## 2021-04-30 ENCOUNTER — Telehealth: Payer: Self-pay | Admitting: *Deleted

## 2021-04-30 NOTE — Telephone Encounter (Signed)
Hydrocodone 5/325 mg has been approved for 04/27/21-10/24/21. JSY ?

## 2021-05-10 ENCOUNTER — Ambulatory Visit: Payer: Medicaid Other | Admitting: Obstetrics & Gynecology

## 2021-05-17 ENCOUNTER — Ambulatory Visit: Payer: Self-pay

## 2021-05-17 ENCOUNTER — Ambulatory Visit: Payer: Medicaid Other | Admitting: Obstetrics & Gynecology

## 2021-05-17 NOTE — Lactation Note (Addendum)
This note was copied from a baby's chart. ?Lactation Consultation Note ? ?Patient Name: Catherine Le ?Today's Date: 05/17/2021 ?Reason for consult: Initial assessment;Mother's request;Difficult latch;Early term 37-38.6wks;Breastfeeding assistance;MD order;Other (Comment) (Infant experiencing emesis after feedings via nose and mouth) ?Age:26 wk.o. ? ?Infant admitted due to decrease in intake both at the breast and with supplementation with breast milk. Infant bouts of emesis after feedings both nose and mouth ? ?Mom tooth extraction on 5/3 and due to medication discontinued latching and pumping for 24 hrs. Mom noted since then decline in her milk supply. Last pumping session one day prior to admission, mom able to pump 250 ml.  Milk spoiled and was discarded. Since admission, Mom pumped once getting 3 ml.  ? ?LC set up wash station providing basins to clean pump parts.  ?Mom following feeding plan established by SLP feeding q 2 hrs 2 ounces.  ? ?LC attempted to do pre and post weights to acces ability to transfer from breast. Infant last feeding 3 hrs prior, infant during visit with LC had emesis from nose and mouth RN alerted to suction infant.  ? ?Mom continue to follow feeding plan established by SLP.  ?Mom working on getting back on regular pumping regimen, DEBP q 3hrs for 20 min on maintenance setting.  ? ?Mom supplementing with EBM first followed by formula to meet infant's caloric needs. Mom aware to latch prior to supplementation if infant shows interests.  ? ?All questions answered at the end of the visit.  ? ?Maternal Data ?  ? ?Feeding ?Mother's Current Feeding Choice: Breast Milk and Formula ?Nipple Type: Dr. Irving Burton Preemie ? ?LATCH Score ?  ? ?  ? ?  ? ?  ? ?  ? ?  ? ? ?Lactation Tools Discussed/Used ?Tools: Pump;Flanges ?Flange Size: 27 ?Breast pump type: Double-Electric Breast Pump ?Pump Education: Setup, frequency, and cleaning;Milk Storage ?Reason for Pumping: increase stimulation ?Pumping  frequency: every 3 hrs for 15 min ? ?Interventions ?Interventions: Breast feeding basics reviewed;Skin to skin;Breast massage;Expressed milk;DEBP;Education;Theme park manager brochure ? ?Discharge ?Discharge Education: Engorgement and breast care ?Pump: Hands Free;DEBP ? ?Consult Status ?Consult Status: Follow-up ?Date: 05/18/21 ?Follow-up type: In-patient ? ? ? ?Catherine Skelton  Le ?05/17/2021, 6:47 PM ? ? ? ?

## 2021-05-18 ENCOUNTER — Ambulatory Visit: Payer: Medicaid Other | Admitting: Obstetrics & Gynecology

## 2021-05-21 ENCOUNTER — Encounter: Payer: Self-pay | Admitting: Obstetrics & Gynecology

## 2021-05-21 ENCOUNTER — Ambulatory Visit (INDEPENDENT_AMBULATORY_CARE_PROVIDER_SITE_OTHER): Payer: Medicaid Other | Admitting: Obstetrics & Gynecology

## 2021-05-21 DIAGNOSIS — Z98891 History of uterine scar from previous surgery: Secondary | ICD-10-CM

## 2021-05-21 MED ORDER — ESCITALOPRAM OXALATE 20 MG PO TABS: 20.0000 mg | ORAL_TABLET | Freq: Every day | ORAL | 3 refills | Status: DC

## 2021-05-21 MED ORDER — NORETHINDRONE 0.35 MG PO TABS: 1.0000 | ORAL_TABLET | Freq: Every day | ORAL | 11 refills | Status: DC

## 2021-05-21 NOTE — Progress Notes (Signed)
?  HPI: ?Patient returns for routine postoperative follow-up having undergone primary Caesarean section + BTL on 04/09/21.  ?The patient's immediate postoperative recovery has been unremarkable. ?Since hospital discharge the patient reports no problems. ? ? ?Current Outpatient Medications: ?acetaminophen (TYLENOL) 500 MG tablet, Take 2 tablets (1,000 mg total) by mouth every 6 (six) hours., Disp: 30 tablet, Rfl: 0 ?ferrous sulfate 325 (65 FE) MG tablet, Take 1 tablet (325 mg total) by mouth every other day., Disp: 45 tablet, Rfl: 2 ?ibuprofen (ADVIL) 600 MG tablet, Take 1 tablet (600 mg total) by mouth every 6 (six) hours., Disp: 30 tablet, Rfl: 0 ?norethindrone (MICRONOR) 0.35 MG tablet, Take 1 tablet (0.35 mg total) by mouth daily., Disp: 28 tablet, Rfl: 11 ?Prenatal Vit-Fe Fumarate-FA (PRENATAL MULTIVITAMIN) TABS tablet, Take 1 tablet by mouth in the morning., Disp: , Rfl:  ? ?No current facility-administered medications for this visit. ? ? ? ?Last menstrual period 07/17/2020, currently breastfeeding. ? ?Physical Exam: ?Incision clean dry intact ?Abdomen is soft normal post op ? ?Diagnostic Tests: ? ? ?Pathology: ?Benign + tubes ? ?Impression + Management plan: ?  ICD-10-CM   ?1. S/P cesarean section + Bilateral salpingectomy 04/09/21  Z98.891   ?  ? ? ? ? ? ?Medications Prescribed this encounter: ?Meds ordered this encounter  ?Medications  ? norethindrone (MICRONOR) 0.35 MG tablet  ?  Sig: Take 1 tablet (0.35 mg total) by mouth daily.  ?  Dispense:  28 tablet  ?  Refill:  11  ? escitalopram (LEXAPRO) 20 MG tablet  ?  Sig: Take 1 tablet (20 mg total) by mouth daily.  ?  Dispense:  30 tablet  ?  Refill:  3  ? ? ? ? ? ?For cycle management ? ?Follow up: ?Return in about 2 years (around 05/22/2023), or if symptoms worsen or fail to improve. ? ? ? ?Florian Buff, MD ?Attending Physician for the Center for [facility] and ?[facility] ?05/21/2021 ?11:37 AM ? ? ? ? ?

## 2021-06-16 ENCOUNTER — Encounter: Payer: Self-pay | Admitting: *Deleted

## 2021-06-16 ENCOUNTER — Telehealth: Payer: Self-pay | Admitting: Obstetrics & Gynecology

## 2021-06-16 NOTE — Telephone Encounter (Signed)
Patient states she is needing a note to be sent to her job stating Dr Despina Hidden is keeping her out of work until she is seen at her next appointment for f/u for PP depression. Dr Despina Hidden is out of the office until Friday so will talk with him to confirm.

## 2021-06-16 NOTE — Telephone Encounter (Signed)
Patient states that her employer needs a note to extend her leave. Please advise.

## 2021-06-18 ENCOUNTER — Encounter: Payer: Self-pay | Admitting: *Deleted

## 2021-08-20 ENCOUNTER — Ambulatory Visit: Payer: Medicaid Other | Admitting: Obstetrics & Gynecology

## 2021-08-27 ENCOUNTER — Ambulatory Visit: Payer: Medicaid Other | Admitting: Obstetrics & Gynecology

## 2021-09-17 ENCOUNTER — Ambulatory Visit: Payer: Medicaid Other | Admitting: Obstetrics & Gynecology

## 2021-11-29 IMAGING — US US OB COMP LESS 14 WK
1 series · 15 of 28 positions shown · non-contrast
Comparison: 06/04/2019

CLINICAL DATA: Vaginal bleeding.  Twin pregnancy.

EXAM:
OBSTETRIC <14 WK ULTRASOUND
TECHNIQUE: Transabdominal ultrasound was performed for evaluation of the
gestation as well as the maternal uterus and adnexal regions.

[Series 1: us ob comp less 14 wk · 15 of 38 slices shown]
[im 1/38]
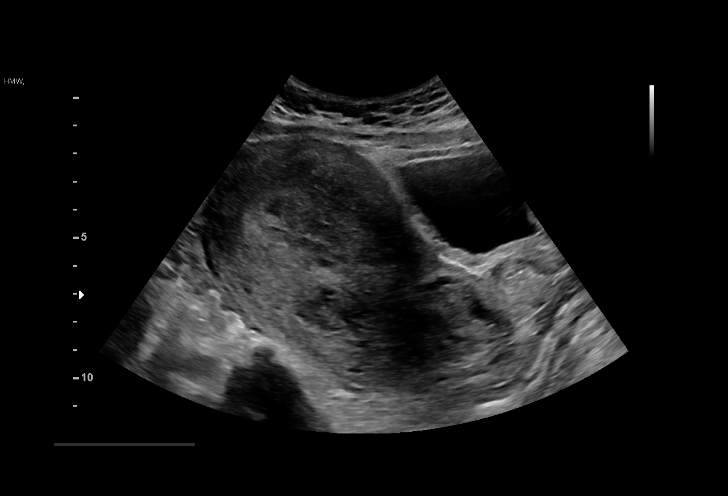
[im 3/38]
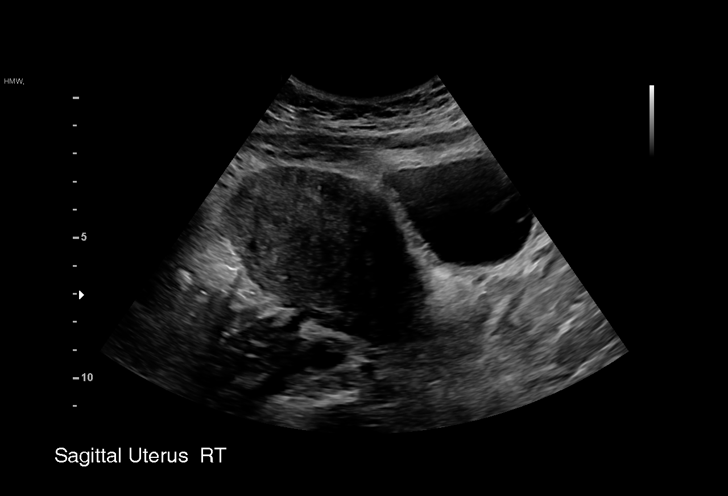
[im 6/38]
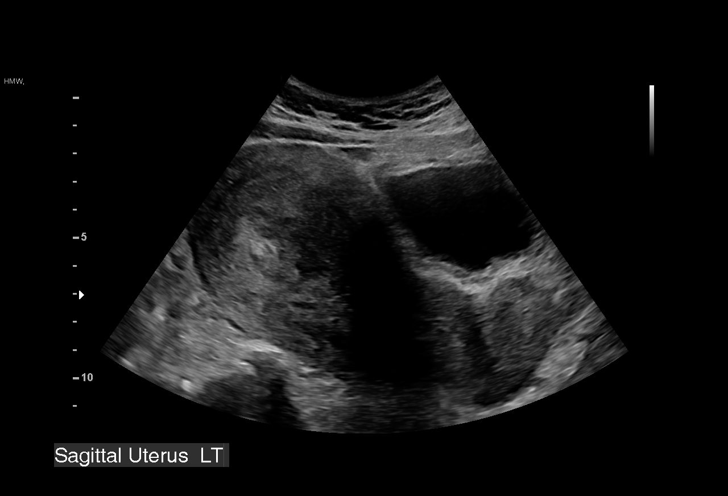
[im 9/38]
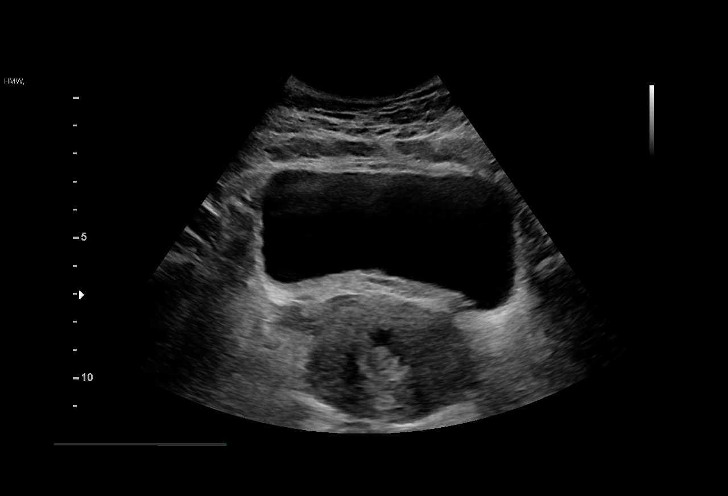
[im 11/38]
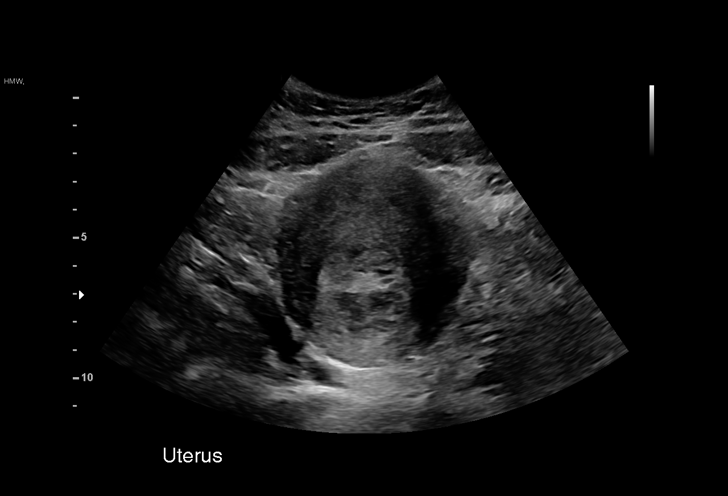
[im 14/38]
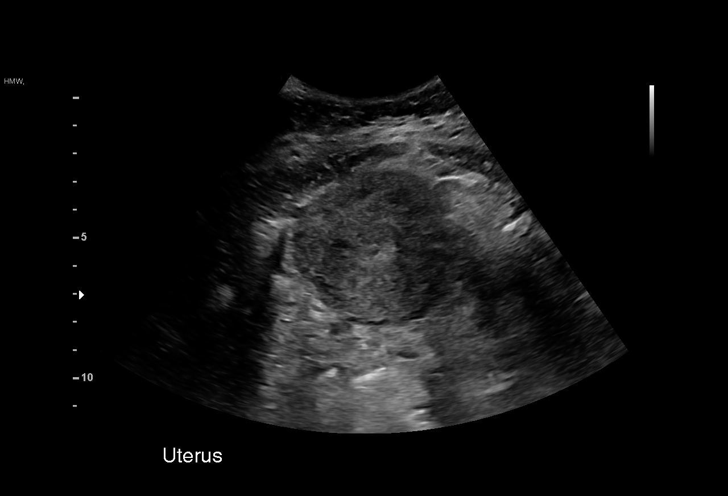
[im 17/38]
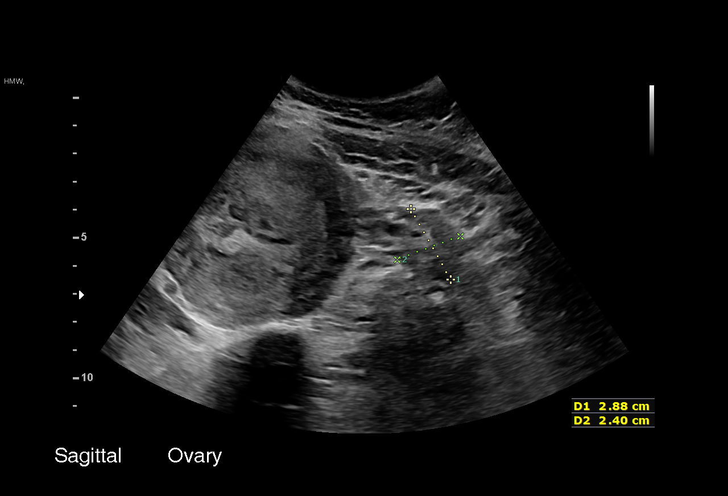
[im 20/38]
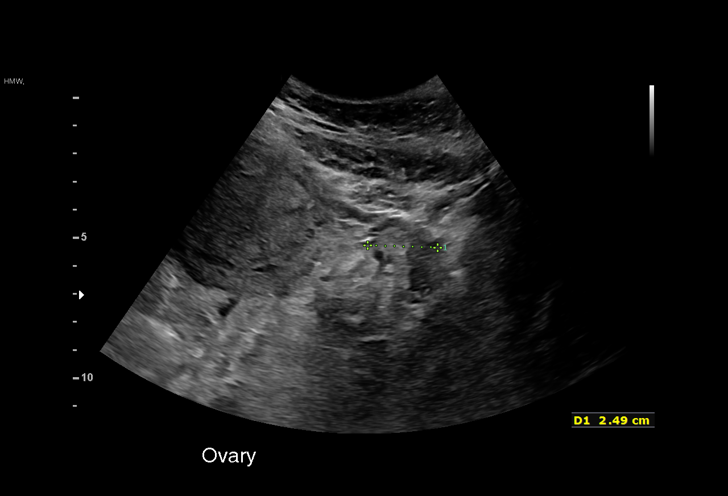
[im 21/38]
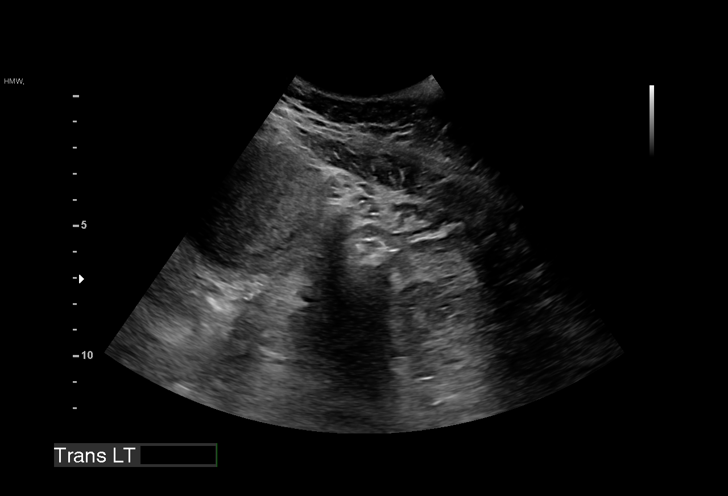
[im 24/38]
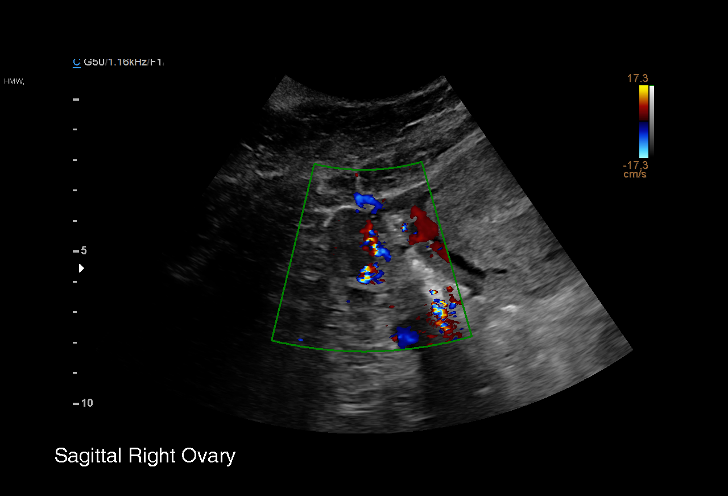
[im 27/38]
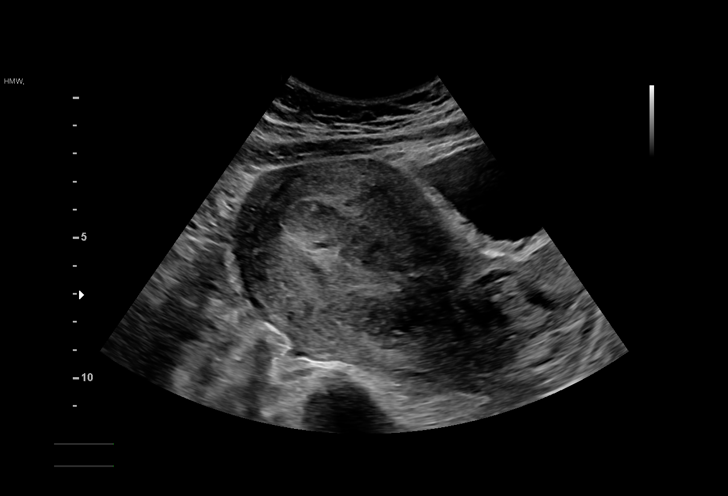
[im 29/38]
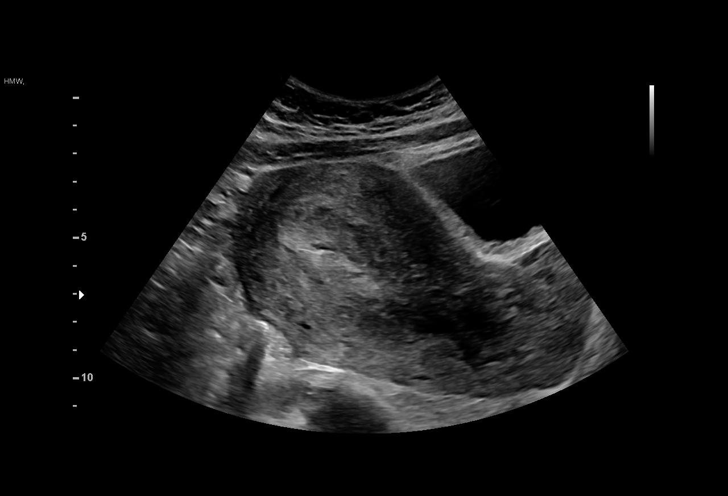
[im 32/38]
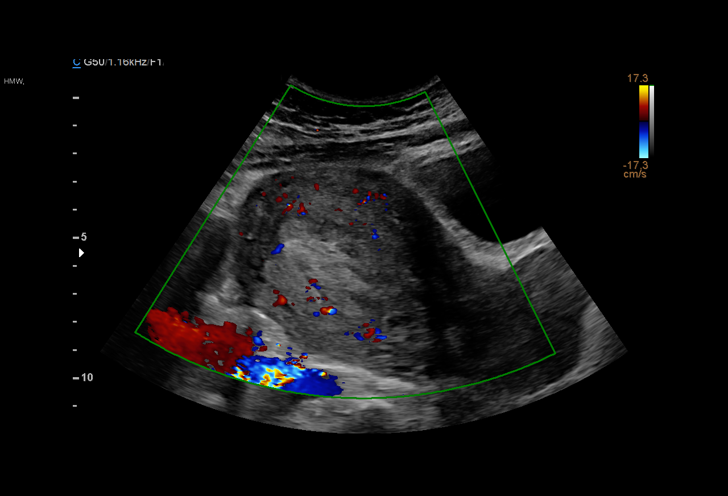
[im 35/38]
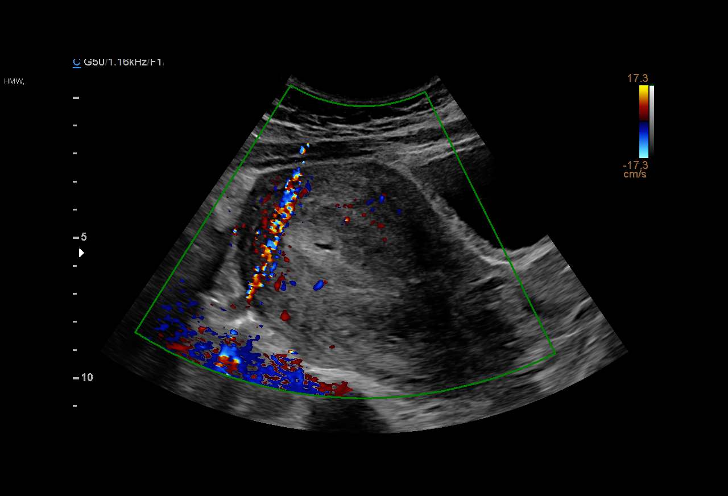
[im 38/38]
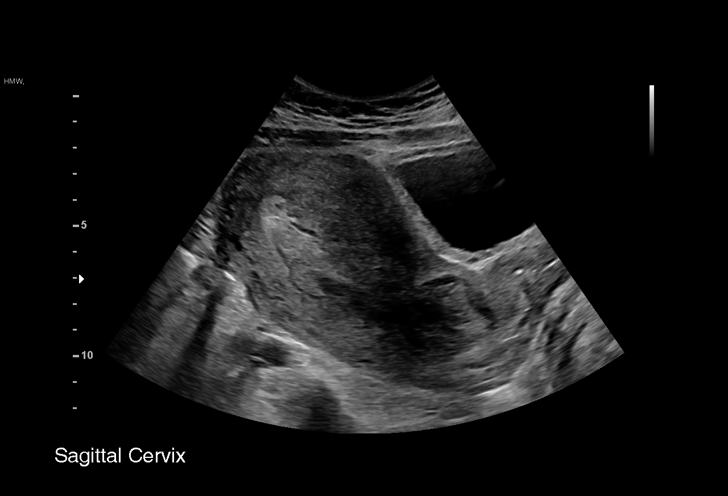

[15 of 28 positions shown; findings below may reference images not displayed]

FINDINGS: Intrauterine gestational sac: None; previously seen twin
intrauterine gestations are no longer visualized.

Maternal uterus/adnexae: Endometrium appears heterogeneous and
measures approximately 19 mm in thickness. No fibroids identified.
Both ovaries are normal in appearance. No adnexal mass or abnormal
free fluid identified.
IMPRESSION: Interval spontaneous abortion, with previously seen twin IUP no
longer visualized.

## 2022-02-21 ENCOUNTER — Encounter (HOSPITAL_COMMUNITY): Payer: Self-pay | Admitting: Emergency Medicine

## 2022-02-21 ENCOUNTER — Ambulatory Visit (HOSPITAL_COMMUNITY)
Admission: EM | Admit: 2022-02-21 | Discharge: 2022-02-21 | Disposition: A | Discharge status: Home / Self Care | Payer: Medicaid Other | Attending: Physician Assistant | Admitting: Physician Assistant

## 2022-02-21 DIAGNOSIS — J029 Acute pharyngitis, unspecified: Secondary | ICD-10-CM | POA: Diagnosis not present

## 2022-02-21 LAB — POCT RAPID STREP A, ED / UC: Streptococcus, Group A Screen (Direct): NEGATIVE

## 2022-02-21 MED ORDER — METHYLPREDNISOLONE SODIUM SUCC 125 MG IJ SOLR: 80.0000 mg | Freq: Once | INTRAMUSCULAR | Status: DC

## 2022-02-21 MED ORDER — AMOXICILLIN 500 MG PO CAPS: 500.0000 mg | ORAL_CAPSULE | Freq: Two times a day (BID) | ORAL | 0 refills | Status: AC

## 2022-02-21 MED ORDER — METHYLPREDNISOLONE SODIUM SUCC 125 MG IJ SOLR
INTRAMUSCULAR | Status: AC
  Filled 2022-02-21: qty 2

## 2022-02-21 MED ORDER — LIDOCAINE VISCOUS HCL 2 % MT SOLN: 15.0000 mL | Freq: Once | OROMUCOSAL | Status: DC

## 2022-02-21 MED ORDER — KETOROLAC TROMETHAMINE 30 MG/ML IJ SOLN
INTRAMUSCULAR | Status: AC
  Filled 2022-02-21: qty 1

## 2022-02-21 MED ORDER — LIDOCAINE VISCOUS HCL 2 % MT SOLN
OROMUCOSAL | Status: AC
  Filled 2022-02-21: qty 15

## 2022-02-21 MED ORDER — KETOROLAC TROMETHAMINE 30 MG/ML IJ SOLN
30.0000 mg | Freq: Once | INTRAMUSCULAR | Status: AC
  Administered 2022-02-21: 30 mg via INTRAMUSCULAR

## 2022-02-21 NOTE — ED Triage Notes (Signed)
States her throat has been swelling/sore over the last couple of days, and that today is the worst it's been. Says she feels like it's closing, is currently spitting into a bag. Hx of tonsillectomy.

## 2022-02-21 NOTE — Discharge Instructions (Signed)
Start antibiotic today Continue with Tylenol or Ibuprofen as needed for fever and discomfort If you are unable to swallow liquids or have trouble breathing go to the Emergency Department for evaluation right away.

## 2022-02-21 NOTE — ED Provider Notes (Signed)
[facility]    CSN: [id number] Arrival date & time: 02/21/22  1928      History   Chief Complaint Chief Complaint  Patient presents with   Sore Throat    HPI Catherine Le is a 27 y.o. female.   Complains of sore throat and swelling over the last few days she reports today is much worse.  She feels like her throat is closing.  She is spitting up secretions in the room.  She has a history of tonsillectomy.  Fever today.  Denies congestion, runny nose, nausea, vomiting.  She has been taking nothing for her symptoms.    Past Medical History:  Diagnosis Date   Headache    History of postpartum depression    Irregular bleeding 06/16/2015   [facility] 06/16/2015    Patient Active Problem List   Diagnosis Date Noted   Twin pregnancy, twins dichorionic and diamniotic 04/09/2021   H/O cesarean section 04/09/2021   Unstable lie of fetus 03/28/2021   Supervision of high-risk pregnancy 10/06/2020   Dichorionic diamniotic twin gestation 10/06/2020   [facility] 06/16/2015   Irregular bleeding 06/16/2015    Past Surgical History:  Procedure Laterality Date   CESAREAN SECTION MULTI-GESTATIONAL WITH TUBAL Bilateral 04/09/2021   Procedure: CESAREAN SECTION MULTI-GESTATIONAL WITH TUBAL;  Surgeon: Florian Buff, MD;  Location: [facility] LD ORS;  Service: Obstetrics;  Laterality: Bilateral;   TONSILLECTOMY      OB History     Gravida  5   Para  3   Term  3   Preterm      AB  2   Living  4      SAB  2   IAB      Ectopic      Multiple  1   Live Births  4            Home Medications    Prior to Admission medications   Medication Sig Start Date End Date Taking? Authorizing Provider  amoxicillin (AMOXIL) 500 MG capsule Take 1 capsule (500 mg total) by mouth 2 (two) times daily for 10 days. 02/21/22 03/03/22 Yes Ward, Lenise Arena, PA-C  acetaminophen (TYLENOL) 500 MG tablet Take 2 tablets (1,000 mg total) by mouth every 6 (six) hours. 04/12/21   Cresenzo-Dishmon,  Joaquim Lai, CNM  escitalopram (LEXAPRO) 20 MG tablet Take 1 tablet (20 mg total) by mouth daily. 05/21/21   Florian Buff, MD  ferrous sulfate 325 (65 FE) MG tablet Take 1 tablet (325 mg total) by mouth every other day. 02/09/21   Roma Schanz, CNM  ibuprofen (ADVIL) 600 MG tablet Take 1 tablet (600 mg total) by mouth every 6 (six) hours. 04/12/21   Cresenzo-Dishmon, Joaquim Lai, CNM  norethindrone (MICRONOR) 0.35 MG tablet Take 1 tablet (0.35 mg total) by mouth daily. 05/21/21   Florian Buff, MD  Prenatal Vit-Fe Fumarate-FA (PRENATAL MULTIVITAMIN) TABS tablet Take 1 tablet by mouth in the morning.    [provider]    Family History Family History  Problem Relation Age of Onset   Hypertension Father    Asthma Father    Cancer Maternal Aunt        breast   Hypertension Maternal Grandmother    Cancer Maternal Grandmother        lung   Hypertension Maternal Grandfather     Social History Social History   Tobacco Use   Smoking status: Never   Smokeless tobacco: Never  Vaping Use  Vaping Use: Never used  Substance Use Topics   Alcohol use: No   Drug use: No     Allergies   Shrimp [shellfish allergy]   Review of Systems Review of Systems  Constitutional:  Positive for fever. Negative for chills.  HENT:  Positive for sore throat. Negative for congestion and ear pain.   Eyes:  Negative for pain and visual disturbance.  Respiratory:  Negative for cough and shortness of breath.   Cardiovascular:  Negative for chest pain and palpitations.  Gastrointestinal:  Negative for abdominal pain and vomiting.  Genitourinary:  Negative for dysuria and hematuria.  Musculoskeletal:  Negative for arthralgias and back pain.  Skin:  Negative for color change and rash.  Neurological:  Negative for seizures and syncope.  All other systems reviewed and are negative.    Physical Exam Triage Vital Signs ED Triage Vitals  Enc Vitals Group     BP 02/21/22 1959 110/69     Pulse Rate  02/21/22 1959 (!) 109     Resp 02/21/22 1959 16     Temp 02/21/22 1959 (!) 100.8 F (38.2 C)     Temp Source 02/21/22 1959 Oral     SpO2 02/21/22 1959 98 %     Weight --      Height --      Head Circumference --      Peak Flow --      Pain Score 02/21/22 2000 8     Pain Loc --      Pain Edu? --      Excl. in [place]? --    No data found.  Updated Vital Signs BP 110/69 (BP Location: Left Arm)   Pulse (!) 109   Temp (!) 100.8 F (38.2 C) (Oral)   Resp 16   SpO2 98%   Breastfeeding Unknown Comment: States she is pumping and dumping currently  Visual Acuity Right Eye Distance:   Left Eye Distance:   Bilateral Distance:    Right Eye Near:   Left Eye Near:    Bilateral Near:     Physical Exam Vitals and nursing note reviewed.  Constitutional:      General: She is not in acute distress.    Appearance: She is well-developed.  HENT:     Head: Normocephalic and atraumatic.     Mouth/Throat:     Pharynx: Pharyngeal swelling and posterior oropharyngeal erythema present. No oropharyngeal exudate or uvula swelling.  Eyes:     Conjunctiva/sclera: Conjunctivae normal.  Cardiovascular:     Rate and Rhythm: Normal rate and regular rhythm.     Heart sounds: No murmur heard. Pulmonary:     Effort: Pulmonary effort is normal. No respiratory distress.     Breath sounds: Normal breath sounds.  Abdominal:     Palpations: Abdomen is soft.     Tenderness: There is no abdominal tenderness.  Musculoskeletal:        General: No swelling.     Cervical back: Neck supple.  Skin:    General: Skin is warm and dry.     Capillary Refill: Capillary refill takes less than 2 seconds.  Neurological:     Mental Status: She is alert.  Psychiatric:        Mood and Affect: Mood normal.      UC Treatments / Results  Labs (all labs ordered are listed, but only abnormal results are displayed) Labs Reviewed  POCT RAPID STREP A, ED / UC    EKG  Radiology No results  found.  Procedures Procedures (including critical care time)  Medications Ordered in UC Medications  methylPREDNISolone sodium succinate (SOLU-MEDROL) 125 mg/2 mL injection 80 mg (80 mg Intramuscular Incomplete 02/21/22 2020)  lidocaine (XYLOCAINE) 2 % viscous mouth solution 15 mL (15 mLs Mouth/Throat Incomplete 02/21/22 2021)  ketorolac (TORADOL) 30 MG/ML injection 30 mg (30 mg Intramuscular Given 02/21/22 2014)    Initial Impression / Assessment and Plan / UC Course  I have reviewed the triage vital signs and the nursing notes.  Pertinent labs & imaging results that were available during my care of the patient were reviewed by me and considered in my medical decision making (see chart for details).     Pharyngitis.  Will cover with antibiotics.  Injection given in clinic today.  Patient with no difficulty breathing, speaking in complete sentences uvula midline.  ED precautions given. Final Clinical Impressions(s) / UC Diagnoses   Final diagnoses:  Pharyngitis, unspecified etiology     Discharge Instructions      Start antibiotic today Continue with Tylenol or Ibuprofen as needed for fever and discomfort If you are unable to swallow liquids or have trouble breathing go to the Emergency Department for evaluation right away.      ED Prescriptions     Medication Sig Dispense Auth. Provider   amoxicillin (AMOXIL) 500 MG capsule Take 1 capsule (500 mg total) by mouth 2 (two) times daily for 10 days. 20 capsule Ward, Lenise Arena, PA-C      PDMP not reviewed this encounter.   Ward, Lenise Arena, PA-C 02/21/22 2054

## 2022-08-19 ENCOUNTER — Other Ambulatory Visit (HOSPITAL_COMMUNITY): Payer: Self-pay

## 2022-11-03 ENCOUNTER — Encounter: Payer: Self-pay | Admitting: Adult Health

## 2022-11-03 ENCOUNTER — Ambulatory Visit (INDEPENDENT_AMBULATORY_CARE_PROVIDER_SITE_OTHER): Payer: Medicaid Other | Admitting: Adult Health

## 2022-11-03 VITALS — BP 119/70 | Ht 61.0 in | Wt 265.8 lb

## 2022-11-03 DIAGNOSIS — Z713 Dietary counseling and surveillance: Secondary | ICD-10-CM | POA: Diagnosis not present

## 2022-11-03 MED ORDER — PHENTERMINE HCL 30 MG PO CAPS
30.0000 mg | ORAL_CAPSULE | ORAL | 0 refills | Status: DC
Start: 2022-11-03 — End: 2022-12-01

## 2022-11-03 NOTE — Progress Notes (Signed)
Patient ID: Catherine Le, female   DOB: 1996-01-07, 27 y.o.   MRN: [id number]   TELEHEALTH GYNECOLOGY VISIT ENCOUNTER NOTE  [facility]: [facility]   Patient location: Home  I connected with Catherine Le on 11/03/22 at  2:10 PM EDT by telephone and verified that I am speaking with the correct person using two identifiers. Patient was unable to do MyChart audiovisual encounter due to technical difficulties, she tried several times.    I discussed the limitations, risks, security and privacy concerns of performing an evaluation and management service by telephone and the availability of in person appointments. I also discussed with the patient that there may be a patient responsible charge related to this service. The patient expressed understanding and agreed to proceed.   History:  DYNESTY PRISBY is a 27 y.o. [phone] female being evaluated today for weight loss meds. She denies any other concerns.       Past Medical History:  Diagnosis Date   Headache    History of postpartum depression    Irregular bleeding 06/16/2015   [facility]16[facility]   Past Surgical History:  Procedure Laterality Date   CESAREAN SECTION MULTI-GESTATIONAL WITH TUBAL Bilateral 04/09/2021   Procedure: CESAREAN SECTION MULTI-GESTATIONAL WITH TUBAL;  Surgeon: Lazaro Arms, MD;  Location: [facility] LD [facility]S;  Service: Obstetrics;  Laterality: Bilateral;   TONSILLECTOMY     The following portions of the patient's history were reviewed and updated as appropriate: allergies, current medications, past family history, past medical history, past social history, past surgical history and problem list.   Health Maintenance:  Normal pap and negative HRHPV on 10/06/20  Review of Systems:  Pertinent items noted in HPI and remainder of comprehensive ROS otherwise negative.  Physical Exam:   General:  Alert, oriented and cooperative.   Mental Status: Normal mood and affect perceived.  Normal judgment and thought content.  Physical exam deferred due to nature of the encounter BP 119/70 (BP Location: Left Arm, Patient Position: Sitting)   Ht 5\' 1"  (1.549 m)   Wt 265 lb 12.8 oz (120.6 kg)   LMP 10/20/2022   Breastfeeding No   BMI 50.22 kg/m    Upstream - 11/03/22 1426       Pregnancy Intention Screening   Does the patient want to become pregnant in the next year? No    Does the patient's partner want to become pregnant in the next year? No    Would the patient like to discuss contraceptive options today? No      Contraception Wrap Up   Current Method Female Sterilization    End Method Female Sterilization    Contraception Counseling Provided No             Labs and Imaging No results found for this or any previous visit (from the past 336 hour(s)). No results found.    Assessment and Plan:     1. Weight loss counseling, encounter for Discussed not having any sugary drinks, decrease carbs and try to eat lean and green, nothing processed Walk 60 minutes every day  Eat more protein  Discussed pros and cons of phentermine and she wants to try Will rx phentermine 30 mg 1 daily Meds ordered this encounter  Medications   phentermine 30 MG capsule    Sig: Take 1 capsule (30 mg total) by mouth every morning.    Dispense:  30 capsule    Refill:  0  Order Specific Question:   Supervising Provider    Answer:   Lazaro Arms [[id number]]   Will need to see in person in 4 weeks for weight and BP check   2. Morbid obesity (HCC)        I discussed the assessment and treatment plan with the patient. The patient was provided an opportunity to ask questions and all were answered. The patient agreed with the plan and demonstrated an understanding of the instructions.   The patient was advised to call back or seek an in-person evaluation/go to the ED if the symptoms worsen or if the condition fails to improve as anticipated.  I provided 10  minutes of  non-face-to-face time during this encounter.   Cyril Mourning, NP Center for [employer], [facility]

## 2022-12-01 ENCOUNTER — Encounter: Payer: Self-pay | Admitting: Adult Health

## 2022-12-01 ENCOUNTER — Ambulatory Visit: Payer: Medicaid Other | Admitting: Adult Health

## 2022-12-01 VITALS — BP 130/80 | HR 84 | Ht 61.0 in | Wt 257.0 lb

## 2022-12-01 DIAGNOSIS — Z713 Dietary counseling and surveillance: Secondary | ICD-10-CM | POA: Diagnosis not present

## 2022-12-01 MED ORDER — PHENTERMINE HCL 30 MG PO CAPS: 30.0000 mg | ORAL_CAPSULE | ORAL | 0 refills | Status: AC

## 2022-12-01 NOTE — Progress Notes (Signed)
  Subjective:     Patient ID: Catherine Le, female   DOB: 19-Nov-1995, 27 y.o.   MRN: [id number]  HPI Catherine Le is a 27 year old black female,single, [id number] in for weight and BP check, started phentermine 30 mg 1 daily in October.     Component Value Date/Time   DIAGPAP  10/06/2020 1452    - Negative for intraepithelial lesion or malignancy (NILM)   HPVHIGH Negative 10/06/2020 1452   ADEQPAP  10/06/2020 1452    Satisfactory for evaluation; transformation zone component PRESENT.   Review of Systems Has lost weight   Reviewed past medical,surgical, social and family history. Reviewed medications and allergies.  Objective:   Physical Exam BP 130/80 (BP Location: Right Arm, Patient Position: Sitting, Cuff Size: Normal)   Pulse 84   Ht 5\' 1"  (1.549 m)   Wt 257 lb (116.6 kg)   LMP 10/20/2022   BMI 48.56 kg/m   lost 8 3/4 lbs Skin warm and dry. Lungs: clear to ausculation bilaterally. Cardiovascular: regular rate and rhythm.      Upstream - 12/01/22 1425       Pregnancy Intention Screening   Does the patient want to become pregnant in the next year? No    Does the patient's partner want to become pregnant in the next year? No    Would the patient like to discuss contraceptive options today? No      Contraception Wrap Up   Current Method Female Sterilization    End Method Female Sterilization    Contraception Counseling Provided No             Assessment:     1. Weight loss counseling, encounter for Continue walking  Drink water Will refill phentermine 30 mg 1 daily  Meds ordered this encounter  Medications   phentermine 30 MG capsule    Sig: Take 1 capsule (30 mg total) by mouth every morning.    Dispense:  30 capsule    Refill:  0    Order Specific Question:   Supervising Provider    Answer:   Despina Hidden, LUTHER H [[id number]]     2. Morbid obesity (HCC)    Plan:     Follow up in 4 weeks for weight and BP check

## 2022-12-30 ENCOUNTER — Ambulatory Visit: Payer: Medicaid Other | Admitting: Adult Health

## 2023-02-11 DIAGNOSIS — R1084 Generalized abdominal pain: Secondary | ICD-10-CM | POA: Insufficient documentation

## 2023-02-12 ENCOUNTER — Emergency Department (HOSPITAL_COMMUNITY)
Admission: EM | Admit: 2023-02-12 | Discharge: 2023-02-12 | Disposition: A | Discharge status: Home / Self Care | Payer: Medicaid Other | Attending: Emergency Medicine | Admitting: Emergency Medicine

## 2023-02-12 ENCOUNTER — Encounter (HOSPITAL_COMMUNITY): Payer: Self-pay | Admitting: *Deleted

## 2023-02-12 ENCOUNTER — Other Ambulatory Visit: Payer: Self-pay

## 2023-02-12 DIAGNOSIS — R109 Unspecified abdominal pain: Secondary | ICD-10-CM

## 2023-02-12 LAB — URINALYSIS, ROUTINE W REFLEX MICROSCOPIC
Bacteria, UA: NONE SEEN
Bilirubin Urine: NEGATIVE
Glucose, UA: NEGATIVE mg/dL
Ketones, ur: NEGATIVE mg/dL
Nitrite: NEGATIVE
Protein, ur: NEGATIVE mg/dL
Specific Gravity, Urine: 1.013 (ref 1.005–1.030)
pH: 6 (ref 5.0–8.0)

## 2023-02-12 LAB — COMPREHENSIVE METABOLIC PANEL
ALT: 14 U/L (ref 0–44)
AST: 17 U/L (ref 15–41)
Albumin: 3.9 g/dL (ref 3.5–5.0)
Alkaline Phosphatase: 56 U/L (ref 38–126)
Anion gap: 11 (ref 5–15)
BUN: 7 mg/dL (ref 6–20)
CO2: 21 mmol/L — ABNORMAL LOW (ref 22–32)
Calcium: 9.3 mg/dL (ref 8.9–10.3)
Chloride: 106 mmol/L (ref 98–111)
Creatinine, Ser: 0.65 mg/dL (ref 0.44–1.00)
GFR, Estimated: >60 mL/min (ref >60)
Glucose, Bld: 101 mg/dL — ABNORMAL HIGH (ref 70–99)
Potassium: 3.3 mmol/L — ABNORMAL LOW (ref 3.5–5.1)
Sodium: 138 mmol/L (ref 135–145)
Total Bilirubin: 0.7 mg/dL (ref 0.0–1.2)
Total Protein: 7.8 g/dL (ref 6.5–8.1)

## 2023-02-12 LAB — HCG, SERUM, QUALITATIVE: Preg, Serum: NEGATIVE

## 2023-02-12 LAB — CBC
HCT: 36.5 % (ref 36.0–46.0)
Hemoglobin: 11.6 g/dL — ABNORMAL LOW (ref 12.0–15.0)
MCH: 27.6 pg (ref 26.0–34.0)
MCHC: 31.8 g/dL (ref 30.0–36.0)
MCV: 86.7 fL (ref 80.0–100.0)
Platelets: 492 10*3/uL — ABNORMAL HIGH (ref 150–400)
RBC: 4.21 MIL/uL (ref 3.87–5.11)
RDW: 15.9 % — ABNORMAL HIGH (ref 11.5–15.5)
WBC: 11.4 10*3/uL — ABNORMAL HIGH (ref 4.0–10.5)
nRBC: 0 % (ref 0.0–0.2)

## 2023-02-12 LAB — LIPASE, BLOOD: Lipase: 22 U/L (ref 11–51)

## 2023-02-12 IMAGING — US US OB COMP LESS 14 WK
1 series · 15 of 28 positions shown · non-contrast
Comparison: None.

CLINICAL DATA: Cramping and spotting

EXAM:
TWIN OBSTETRICAL ULTRASOUND <14 WKS
TECHNIQUE: Transabdominal ultrasound was performed for evaluation of the
gestation as well as the maternal uterus and adnexal regions.

[Series 1: us ob comp less 14 wk · 52 acquisitions, 15 frames shown]
[im 1/52]
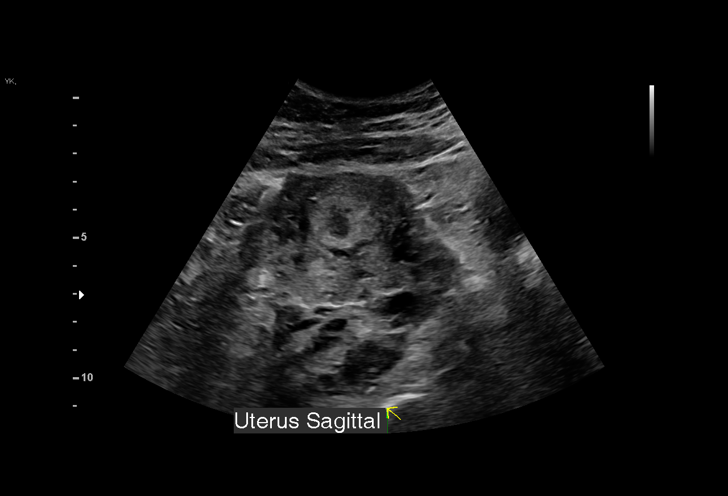
[im 4/52]
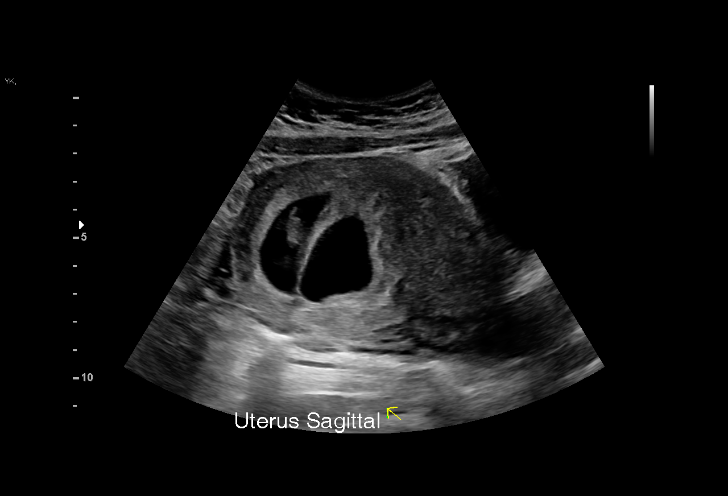
[im 8/52]
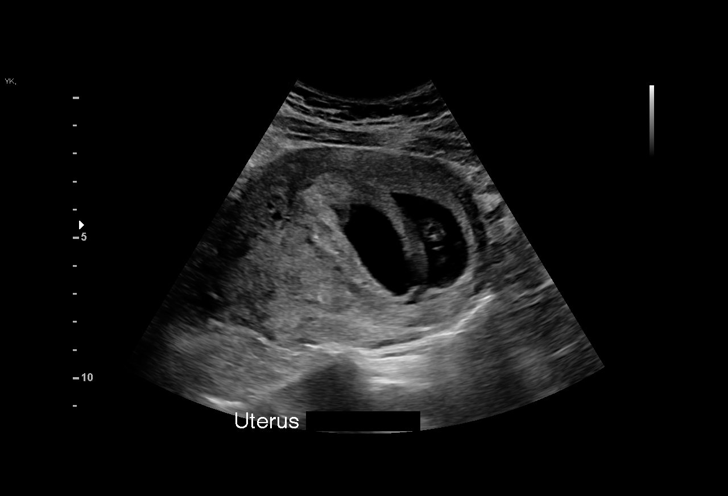
[im 12/52]
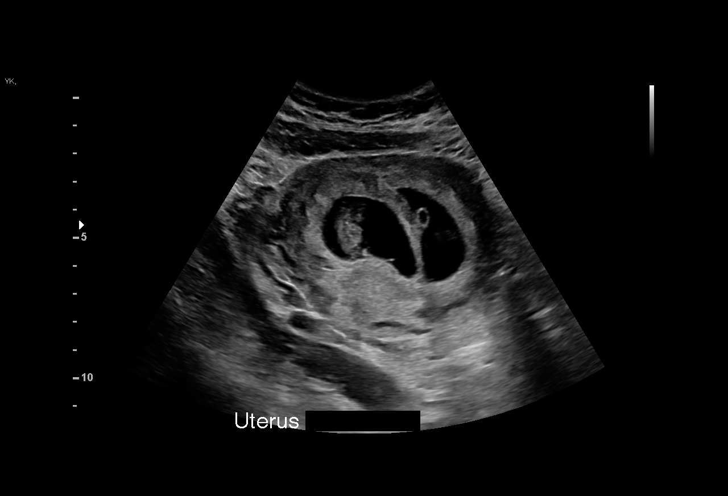
[im 16/52]
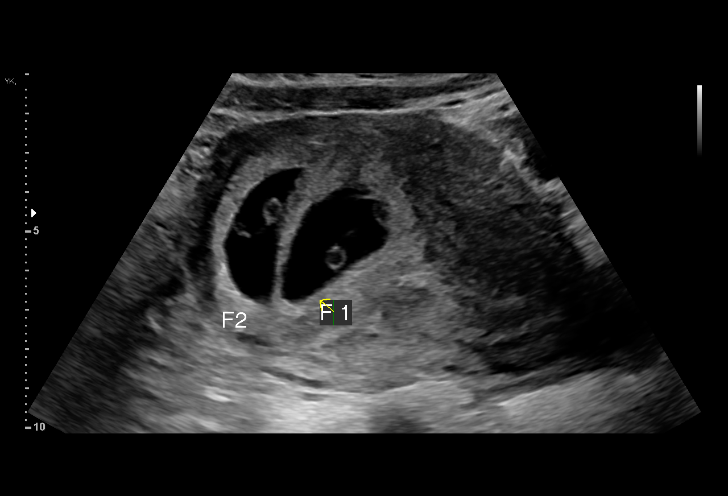
[im 19/52]
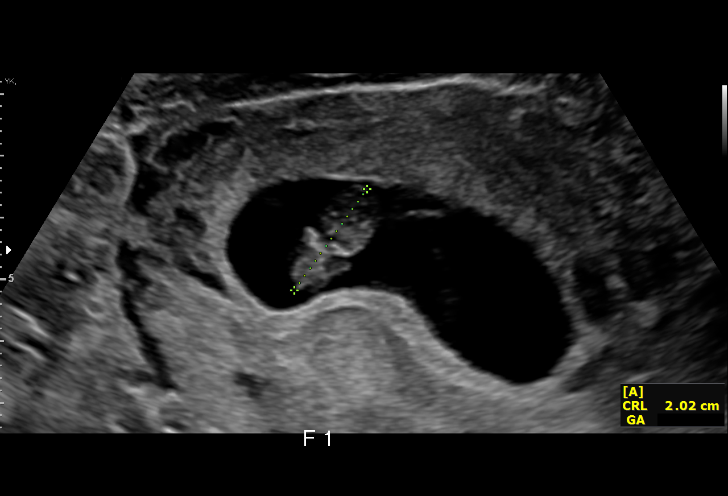
[im 23/52]
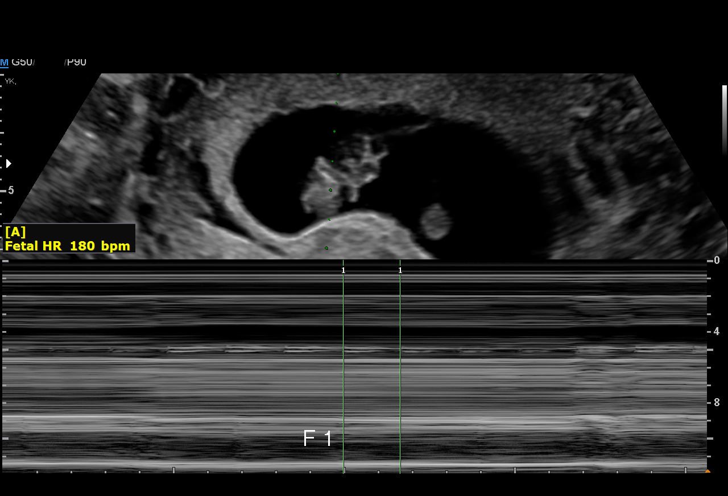
[im 27/52]
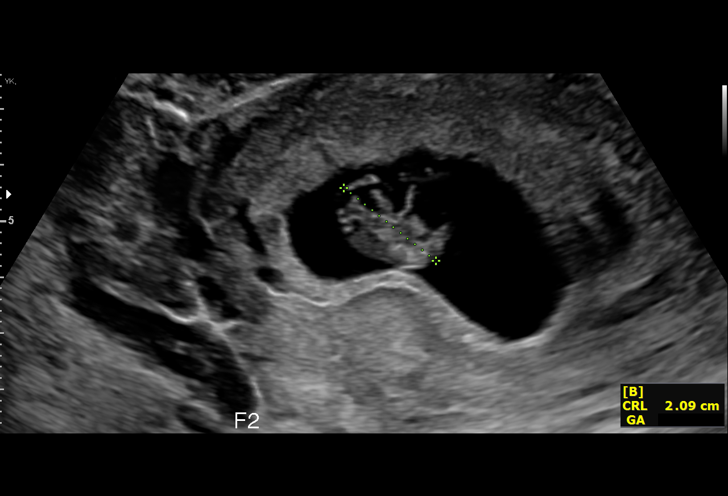
[im 29/52]
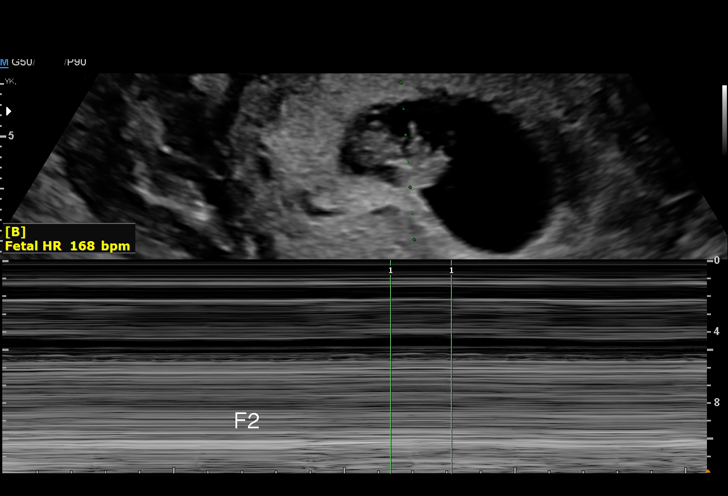
[im 33/52]
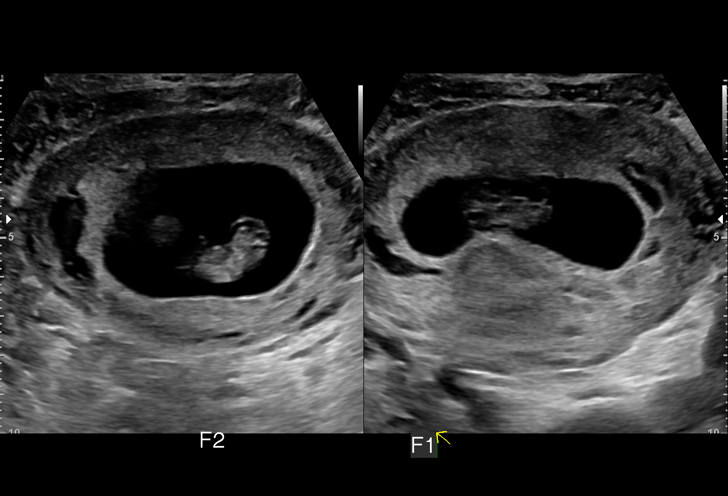
[im 36/52]
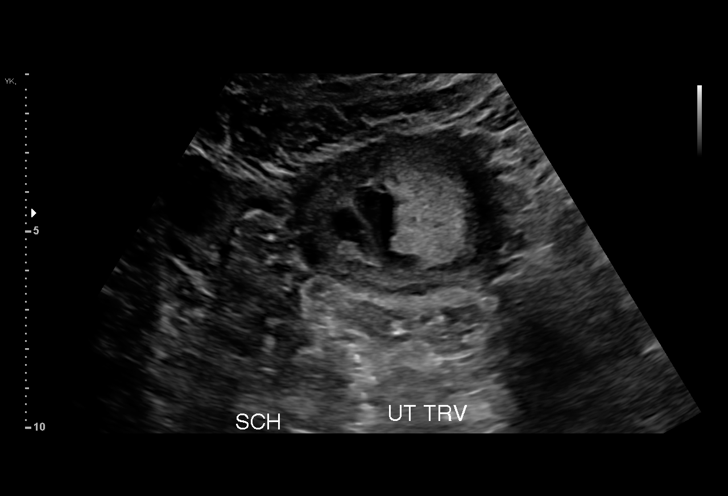
[im 40/52]
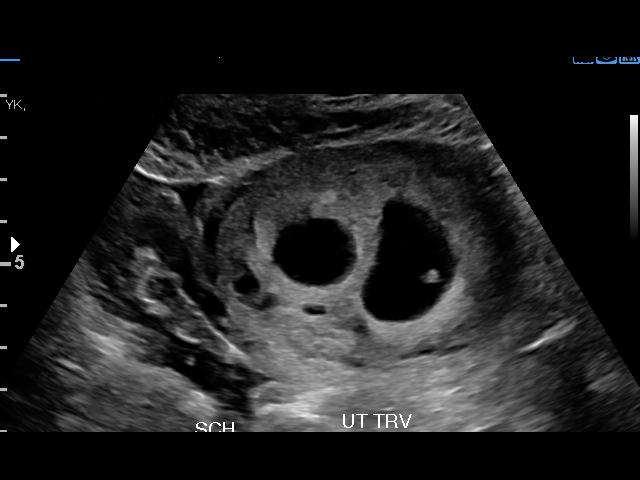
[im 44/52]
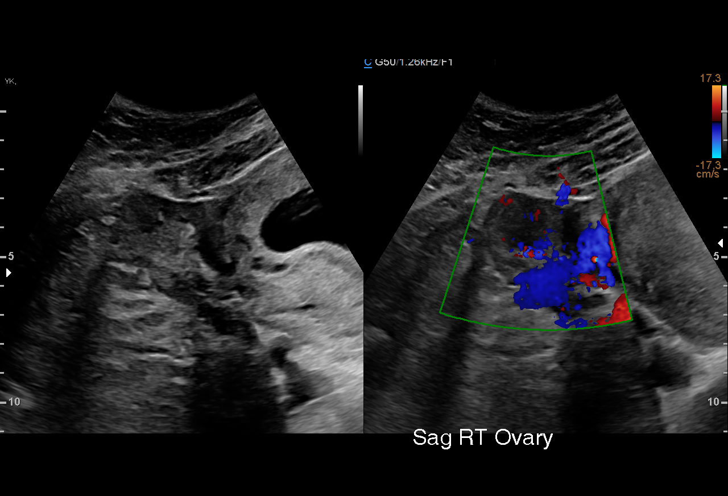
[im 48/52]
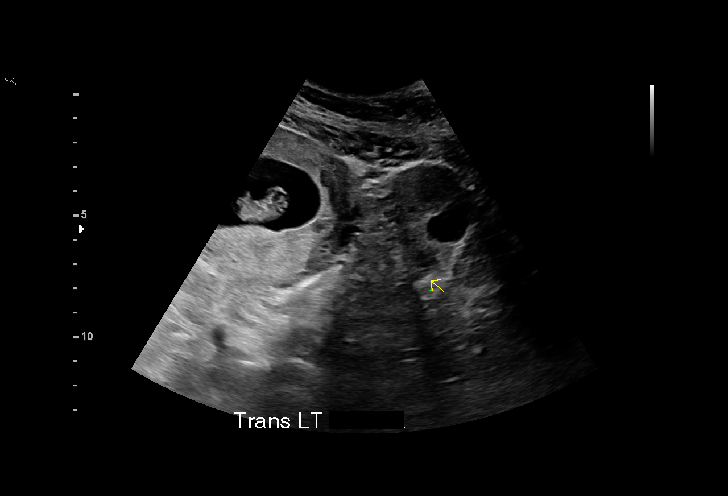
[im 52/52]
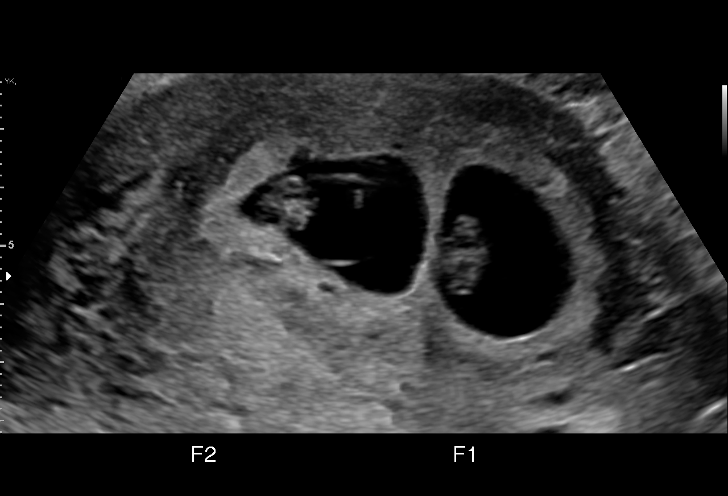

[15 of 28 positions shown; findings below may reference images not displayed]

FINDINGS: Number of IUPs:  2

Chorionicity/Amnionicity:  Dichorionic-diamniotic (thick membrane)

TWIN 1

Yolk sac:  Visualized.

Embryo:  Visualized.

Cardiac Activity: Visualized.

Heart Rate: 174 bpm

CRL:   19.7 mm   8 w 3 d                  US EDC: 04/22/2021

TWIN 2

Yolk sac:  Visualized.

Embryo:  Visualized.

Cardiac Activity: Visualized.

Heart Rate: 167 bpm

CRL:   21.3 mm   8 w 5 d                  US EDC: 04/20/2021

Subchorionic hemorrhage:  Small

Maternal uterus/adnexae: Unremarkable bilateral ovaries. No free
fluid.
IMPRESSION: Viable dichorionic-diamniotic twin intrauterine pregnancy. Estimated
gestational age 8 weeks 3-5 days. Small subchorionic hemorrhage.

## 2023-02-12 MED ORDER — DICYCLOMINE HCL 20 MG PO TABS: 20.0000 mg | ORAL_TABLET | Freq: Two times a day (BID) | ORAL | 0 refills | Status: AC | PRN

## 2023-02-12 MED ORDER — NAPROXEN 250 MG PO TABS
500.0000 mg | ORAL_TABLET | Freq: Once | ORAL | Status: AC
Start: 2023-02-12 — End: 2023-02-12
  Administered 2023-02-12: 500 mg via ORAL
  Filled 2023-02-12: qty 2

## 2023-02-12 NOTE — Discharge Instructions (Addendum)
Your evaluation in the emergency department today was reassuring.  Should you desire STD testing, this can be performed without charge at the health department during the week.  You may use Bentyl for management of abdominal cramping should symptoms not be improved with ibuprofen or Aleve.  Follow-up with a primary care doctor.  You may return for new or concerning symptoms.

## 2023-02-12 NOTE — ED Triage Notes (Signed)
The pt is c/o abd cramps and abd pain for2 1/2 hours  lmp tubal ligation  sl nausea  no vaginal bleeding

## 2023-02-12 NOTE — ED Notes (Signed)
Pt refused hospital gown.  

## 2023-02-24 NOTE — ED Provider Notes (Signed)
[place] EMERGENCY DEPARTMENT AT [facility] Provider Note   CSN: [id number] Arrival date & time: 02/11/23  2350     History  Chief Complaint  Patient presents with   Abdominal Pain    Catherine Le is a 28 y.o. female.  28 y.o female with hx of DUB, headaches, presents for c/o generalized abdominal cramping. Symptoms began around 2200 on 02/11/23. Now spontaneously improving. No medications taken PTA for symptoms. Denies fever, vomiting, diarrhea, melena, hematochezia, urinary symptoms. No known sick contacts.  SHx tubal ligation, C-section  The history is provided by the patient. No language interpreter was used.  Abdominal Pain      Home Medications Prior to Admission medications   Medication Sig Start Date End Date Taking? Authorizing Provider  dicyclomine (BENTYL) 20 MG tablet Take 1 tablet (20 mg total) by mouth every 12 (twelve) hours as needed (for abdominal pain/cramping). 02/12/23  Yes Antony Madura, PA-C  phentermine 30 MG capsule Take 1 capsule (30 mg total) by mouth every morning. 12/01/22   Adline Potter, NP      Allergies    Shrimp [shellfish allergy]    Review of Systems   Review of Systems  Gastrointestinal:  Positive for abdominal pain.  Ten systems reviewed and are negative for acute change, except as noted in the HPI.    Physical Exam Updated Vital Signs BP 123/72 (BP Location: Right Arm)   Pulse 76   Temp 98.5 F (36.9 C) (Oral)   Resp 18   Ht 5\' 1"  (1.549 m)   Wt 116.6 kg   LMP 01/12/2023   SpO2 100%   BMI 48.57 kg/m   Physical Exam Vitals and nursing note reviewed.  Constitutional:      General: She is not in acute distress.    Appearance: She is well-developed. She is not diaphoretic.     Comments: Nontoxic appearing and in NAD  HENT:     Head: Normocephalic and atraumatic.  Eyes:     General: No scleral icterus.    Conjunctiva/sclera: Conjunctivae normal.  Cardiovascular:     Rate and Rhythm: Normal rate and  regular rhythm.     Pulses: Normal pulses.  Pulmonary:     Effort: Pulmonary effort is normal. No respiratory distress.     Comments: Respirations even and unlabored Abdominal:     Palpations: Abdomen is soft.     Tenderness: There is no abdominal tenderness. There is no guarding.     Comments: Abdomen soft, obese, nondistended. No focal TTP.  Musculoskeletal:        General: Normal range of motion.     Cervical back: Normal range of motion.  Skin:    General: Skin is warm and dry.     Coloration: Skin is not pale.     Findings: No erythema or rash.  Neurological:     Mental Status: She is alert and oriented to person, place, and time.     Coordination: Coordination normal.  Psychiatric:        Behavior: Behavior normal.     ED Results / Procedures / Treatments   Labs (all labs ordered are listed, but only abnormal results are displayed) Labs Reviewed  COMPREHENSIVE METABOLIC PANEL - Abnormal; Notable for the following components:      Result Value   Potassium 3.3 (*)    CO2 21 (*)    Glucose, Bld 101 (*)    All other components within normal limits  CBC - Abnormal;  Notable for the following components:   WBC 11.4 (*)    Hemoglobin 11.6 (*)    RDW 15.9 (*)    Platelets 492 (*)    All other components within normal limits  URINALYSIS, ROUTINE W REFLEX MICROSCOPIC - Abnormal; Notable for the following components:   Hgb urine dipstick MODERATE (*)    Leukocytes,Ua SMALL (*)    All other components within normal limits  LIPASE, BLOOD  HCG, SERUM, QUALITATIVE    EKG None  Radiology No results found.  Procedures Procedures    Medications Ordered in ED Medications  naproxen (NAPROSYN) tablet 500 mg (500 mg Oral Given 02/12/23 0516)    ED Course/ Medical Decision Making/ A&P                                 Medical Decision Making Amount and/or Complexity of Data Reviewed Labs: ordered.  Risk Prescription drug management.   This patient presents to the  ED for concern of abdominal cramping, this involves an extensive number of treatment options, and is a complaint that carries with it a high risk of complications and morbidity.  The differential diagnosis includes viral illness vs food-borne illness vs UTI vs ectopic pregnancy vs appendicitis vs constipation   Co morbidities that complicate the patient evaluation  None known   Additional history obtained:  External records from outside source obtained and reviewed including hx of anemia s/p twin delivery in March 2023.   Lab Tests:  I Ordered, and personally interpreted labs.  The pertinent results include:  WBC 11.4, Hgb 11.6 (back to pre-pregnancy baseline), K 3.3. Pregnancy negative. UA with 11-20 WBCs.   Cardiac Monitoring:  The patient was maintained on a cardiac monitor.  I personally viewed and interpreted the cardiac monitored which showed an underlying rhythm of: NSR   Medicines ordered and prescription drug management:  I ordered medication including Naproxen for pain  Reevaluation of the patient after these medicines showed that the patient improved I have reviewed the patients home medicines and have made adjustments as needed   Test Considered:  GC/chlamydia - patient declines, will go to health department for testing if desired   Problem List / ED Course:  Nonspecific abdominal cramping since 2200, improving. Benign abdominal exam. Labs initiated in triage, reassuring. Afebrile and without criteria for SIRS; no concern for sepsis. Offered STI testing given sterile pyuria, but patient declines. No urinary symptoms to suggest acute cystitis. Plan for outpatient supportive care measures, PCP reassessment. No indication for further emergent evaluation.   Reevaluation:  After the interventions noted above, I reevaluated the patient and found that they have :stayed the same   Social Determinants of Health:  Lives independently   Dispostion:  After  consideration of the diagnostic results and the patients response to treatment, I feel that the patent would benefit from trial of Bentyl for symptom control. Encouraged PCP reassessment. Return precautions discussed and provided. Patient discharged in stable condition with no unaddressed concerns.          Final Clinical Impression(s) / ED Diagnoses Final diagnoses:  Abdominal cramping    Rx / DC Orders ED Discharge Orders          Ordered    dicyclomine (BENTYL) 20 MG tablet  Every 12 hours PRN        02/12/23 0509              Antony Madura,  PA-C 02/24/23 1438    Sloan Leiter, DO 02/27/23 [phone]

## 2023-06-05 ENCOUNTER — Emergency Department (HOSPITAL_BASED_OUTPATIENT_CLINIC_OR_DEPARTMENT_OTHER)
Admission: EM | Admit: 2023-06-05 | Discharge: 2023-06-05 | Disposition: A | Discharge status: Home / Self Care | Attending: Emergency Medicine | Admitting: Emergency Medicine

## 2023-06-05 ENCOUNTER — Encounter (HOSPITAL_BASED_OUTPATIENT_CLINIC_OR_DEPARTMENT_OTHER): Payer: Self-pay | Admitting: Emergency Medicine

## 2023-06-05 ENCOUNTER — Emergency Department (HOSPITAL_BASED_OUTPATIENT_CLINIC_OR_DEPARTMENT_OTHER): Admitting: Radiology

## 2023-06-05 ENCOUNTER — Other Ambulatory Visit: Payer: Self-pay

## 2023-06-05 DIAGNOSIS — M25512 Pain in left shoulder: Secondary | ICD-10-CM | POA: Insufficient documentation

## 2023-06-05 DIAGNOSIS — R0781 Pleurodynia: Secondary | ICD-10-CM | POA: Insufficient documentation

## 2023-06-05 LAB — PREGNANCY, URINE: Preg Test, Ur: NEGATIVE

## 2023-06-05 MED ORDER — NAPROXEN 500 MG PO TABS: 500.0000 mg | ORAL_TABLET | Freq: Two times a day (BID) | ORAL | 0 refills | Status: AC

## 2023-06-05 MED ORDER — NAPROXEN 250 MG PO TABS
500.0000 mg | ORAL_TABLET | Freq: Once | ORAL | Status: AC
Start: 2023-06-05 — End: 2023-06-05
  Administered 2023-06-05: 500 mg via ORAL
  Filled 2023-06-05: qty 2

## 2023-06-05 MED ORDER — OXYCODONE HCL 5 MG PO TABS
5.0000 mg | ORAL_TABLET | Freq: Once | ORAL | Status: AC
  Administered 2023-06-05: 5 mg via ORAL
  Filled 2023-06-05: qty 1

## 2023-06-05 MED ORDER — LIDOCAINE 5 % EX PTCH: 1.0000 | MEDICATED_PATCH | CUTANEOUS | 0 refills | Status: AC

## 2023-06-05 MED ORDER — METHOCARBAMOL 500 MG PO TABS: 500.0000 mg | ORAL_TABLET | Freq: Three times a day (TID) | ORAL | 0 refills | Status: AC | PRN
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
